# Patient Record
Sex: Female | Born: 1955 | Race: White | Hispanic: No | Marital: Married | Smoking: Former smoker
Health system: Southern US, Community
[De-identification: ages and names within clinical notes are randomized; demographics above are authoritative.]

## PROBLEM LIST (undated history)

## (undated) DIAGNOSIS — J309 Allergic rhinitis, unspecified: Secondary | ICD-10-CM

## (undated) DIAGNOSIS — K759 Inflammatory liver disease, unspecified: Secondary | ICD-10-CM

## (undated) DIAGNOSIS — E785 Hyperlipidemia, unspecified: Secondary | ICD-10-CM

## (undated) DIAGNOSIS — Z801 Family history of malignant neoplasm of trachea, bronchus and lung: Secondary | ICD-10-CM

## (undated) DIAGNOSIS — E079 Disorder of thyroid, unspecified: Secondary | ICD-10-CM

## (undated) DIAGNOSIS — Z803 Family history of malignant neoplasm of breast: Secondary | ICD-10-CM

## (undated) DIAGNOSIS — I2510 Atherosclerotic heart disease of native coronary artery without angina pectoris: Secondary | ICD-10-CM

## (undated) DIAGNOSIS — K589 Irritable bowel syndrome without diarrhea: Secondary | ICD-10-CM

## (undated) DIAGNOSIS — A6920 Lyme disease, unspecified: Secondary | ICD-10-CM

## (undated) HISTORY — DX: Allergic rhinitis, unspecified: J30.9

## (undated) HISTORY — DX: Atherosclerotic heart disease of native coronary artery without angina pectoris: I25.10

## (undated) HISTORY — DX: Lyme disease, unspecified: A69.20

## (undated) HISTORY — DX: Irritable bowel syndrome, unspecified: K58.9

## (undated) HISTORY — DX: Family history of malignant neoplasm of trachea, bronchus and lung: Z80.1

## (undated) HISTORY — DX: Hyperlipidemia, unspecified: E78.5

## (undated) HISTORY — DX: Disorder of thyroid, unspecified: E07.9

## (undated) HISTORY — DX: Inflammatory liver disease, unspecified: K75.9

## (undated) HISTORY — DX: Family history of malignant neoplasm of breast: Z80.3

---

## 1979-04-30 HISTORY — PX: TMJ ARTHROPLASTY: SHX1066

## 1987-07-30 HISTORY — PX: TUBAL LIGATION: SHX77

## 1994-08-29 HISTORY — PX: CORONARY ANGIOPLASTY WITH STENT PLACEMENT: SHX49

## 1996-12-24 HISTORY — PX: CORONARY ANGIOPLASTY WITH STENT PLACEMENT: SHX49

## 2001-06-25 ENCOUNTER — Other Ambulatory Visit: Admission: RE | Admit: 2001-06-25 | Discharge: 2001-06-25 | Payer: Self-pay | Admitting: Obstetrics and Gynecology

## 2003-05-12 ENCOUNTER — Encounter: Admission: RE | Admit: 2003-05-12 | Discharge: 2003-05-12 | Payer: Self-pay | Admitting: Obstetrics and Gynecology

## 2003-05-12 ENCOUNTER — Encounter: Payer: Self-pay | Admitting: Obstetrics and Gynecology

## 2005-01-25 ENCOUNTER — Emergency Department (HOSPITAL_COMMUNITY): Admission: EM | Admit: 2005-01-25 | Discharge: 2005-01-25 | Payer: Self-pay | Admitting: Emergency Medicine

## 2006-04-18 ENCOUNTER — Ambulatory Visit (HOSPITAL_BASED_OUTPATIENT_CLINIC_OR_DEPARTMENT_OTHER): Admission: RE | Admit: 2006-04-18 | Discharge: 2006-04-18 | Payer: Self-pay | Admitting: Orthopedic Surgery

## 2006-04-23 ENCOUNTER — Emergency Department (HOSPITAL_COMMUNITY): Admission: EM | Admit: 2006-04-23 | Discharge: 2006-04-23 | Payer: Self-pay | Admitting: Family Medicine

## 2007-01-25 ENCOUNTER — Observation Stay (HOSPITAL_COMMUNITY): Admission: EM | Admit: 2007-01-25 | Discharge: 2007-01-27 | Payer: Self-pay | Admitting: Emergency Medicine

## 2007-01-26 HISTORY — PX: CORONARY ANGIOPLASTY WITH STENT PLACEMENT: SHX49

## 2007-03-20 ENCOUNTER — Emergency Department: Payer: Self-pay | Admitting: Emergency Medicine

## 2010-10-29 HISTORY — PX: US ECHOCARDIOGRAPHY: HXRAD669

## 2010-10-29 HISTORY — PX: NM MYOCAR PERF WALL MOTION: HXRAD629

## 2011-01-11 NOTE — Cardiovascular Report (Signed)
Jordan Contreras, Jordan Contreras             ACCOUNT NO.:  [id number]   MEDICAL RECORD NO.:  [id number]          PATIENT TYPE:  INP   LOCATION:  3740                         FACILITY:  [facility]   PHYSICIAN:  Thereasa Solo. Little, M.D. DATE OF BIRTH:  04-Apr-1956   DATE OF PROCEDURE:  01/26/2007  DATE OF DISCHARGE:                            CARDIAC CATHETERIZATION   This 55 year old female was admitted with unstable angina.  She had two  overlapping 3-0 x 15 Palmaz-Schatz stents placed in her LAD in 1996.  In  1998, she presented with unstable angina and had in-stent restenosis  that was treated with a cutting balloon.  She has had minimal followup  since that time but comes back in with chest pain.   After obtaining informed consent the patient was prepped and draped in  the usual sterile fashion exposing the right groin.  Following local  anesthetic with 1% Xylocaine, the Seldinger technique was employed and a  5-French introducer sheath was placed in the right femoral artery.  Left  and right coronary arteriography and ventriculography in the RAO  projection was performed.   Following this, percutaneous intervention to her LAD was performed.   COMPLICATIONS:  None.   MEDICATIONS:  Versed 2 mg, and IV Angiomax with an ACT of 355.   EQUIPMENT:  Five-French Judkins configuration diagnostic catheters.   TOTAL CONTRAST USED:  One hundred sixty mL.   RESULTS:  1. HEMODYNAMIC MONITORING:  Her central aortic pressure was 138/74.      Her left ventricular pressure was 138/3, and there was no aortic      valve gradient noted at the time of pullback.  2. VENTRICULOGRAPHY:  Ventriculography was performed at the end of the      diagnostic cath showed the left ventricle to be normal to      hyperdynamic in function.  The ejection fraction was in excess of      65%, with no focal wall motion abnormalities.  End-diastolic      pressure was 10, and no mitral regurgitation was appreciated.   CORONARY  ARTERIOGRAPHY:  1. Left main normal.  It bifurcated.  2. Circumflex.  The circumflex was free of disease and it had two      large marginal vessels.  3. LAD.  The LAD went to the apex of the heart.  In the mid portion of      the LAD was an area of stents that appeared to have 90% in-stent      restenosis at the second diagonal.  The first diagonal had 40%      ostial narrowing.  The second diagonal had 30% ostial narrowing.  4. Right coronary artery.  The right coronary artery gave rise to a      PDA which was free of disease.  The mid portion of the RCA had 20-      30% eccentric narrowing.   Because of the high-grade in-stent restenosis in the LAD, arrangements  were made for intervention.  Angio max was given and an ACT of 355 was  obtained.   The 5-French system was upgraded  to a 6-French.  A JL-4 6-French guide  catheter was used and a short Luge wire.  The wire was placed well  distal in the LAD.  Initially at 3.0 x 10 cutting balloon was placed  into the stent with two inflations 5 x 33, and 5 x 34.  With these  inflations, I never felt that I was adequately able to maintain the  balloon within the lesion.  It tended to want to watermelon proximally.   A 2.5 x 12 Maverick balloon was then used and placed in the area of the  obstruction.  A single inflation of 6 atmospheres for 33 seconds  resulted in the area being reduced to less than 40%.   A 3.0 x 10 cutting balloon was then replaced into the area and a series  of five inflations 5 for 43, 5 for 37, 5 for 47, and 6 atmospheres for  45  seconds was performed.  With this, the area that had been 90%  narrowed previously, now appeared to be normal.  There was no evidence  of any dissection or thrombus and care was taken to make sure that the  balloon stayed within the stents.   With each inflation, there was marked ST-segment shifts, PVCs, and the  patient experienced chest pain.  All this resolved with deflating the   balloon.   At the end of the procedure, there was concern of the second diagonal  having about 70% ostial narrowing.  I gave her intracoronary  nitroglycerin and there was no substantial change.  I did not feel that  going after this lesion and taking a chance to jeopardize the LAD was  appropriate.   Her Angiomax was discontinued.  She is already on Plavix.  We will  remove sheath later today.  In addition, we will try to wean and  discontinue the nitroglycerin and depending on her labs, she may be  ready for discharge tomorrow with followup with Dr. Tresa Endo.           ______________________________  Thereasa Solo Little, M.D.     ABL/MEDQ  D:  01/26/2007  T:  01/26/2007  Job:  [id number]   cc:   Nicki Guadalajara, M.D.

## 2011-01-11 NOTE — H&P (Signed)
NAMEKIMBERLYE, Jordan Contreras             ACCOUNT NO.:  [id number]   MEDICAL RECORD NO.:  [id number]          PATIENT TYPE:  INP   LOCATION:  1829                         FACILITY:  [facility]   PHYSICIAN:  Nicki Guadalajara, M.D.     DATE OF BIRTH:  1956-04-05   DATE OF ADMISSION:  01/25/2007  DATE OF DISCHARGE:                              HISTORY & PHYSICAL   CHIEF COMPLAINT:  Chest pain.   [name] is a 55 year old Caucasian female, patient of Dr. Tresa Endo,  with a previous history of coronary disease, presented to the emergency  room for evaluation of chest pain.   Onset of pain was on Saturday when patient woke up in the morning, went  to take a shower and then dressed, came downstairs to the kitchen to  take her medications and literally was dabbled with pain.  The pain was  so severe that she could not move, it radiated to her neck and left jaw.  She was all covered in sweat, very weak, nauseated and felt shortness of  breath.  This pain persisted throughout the day off and on and patient  rested and eventually felt better.  The next day, she felt significantly  better and there was a moment when she thought that she would have  another attack of pain, but it actually never occurred.  For the last  couple of days, she has been feeling well, except some mild tiredness.  Last evening, after work, she decided to try to walk on the treadmill  and was able to make a few steps and developed severe, profound dyspnea.  She stopped, went home and this morning had another episode of chest  pain with radiation to the neck and she also felt severe exertional  dyspnea.  Every time she would try to move, even minimal exertion, would  bring on shortness of breath and chest discomfort.  Patient took aspirin  last night and this morning and presented to the emergency room for  evaluation.   PAST MEDICAL HISTORY:  Significant for:  1. Coronary disease.  In 1996, she had a stenting of the left anterior   descending.  Another catheterization was in 1998 and per patient's      report, at that time it was angioplasty performed at that time.      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      Unfortunately, we do not have any access to her medical records      because we do not have any records in our office, patient never      followed up with Dr. Tresa Endo in outpatient setting.  I requested old      catheterization reports from the medical records and medical      hospital and they are pending.  2. Also significant for hypothyroidism, treated.  3. Hyperlipidemia, for a long time patient was in Lipitor studies at      [facility].  4. She said that around Christmas time, she had been seen at [facility] with bloody diarrhea that was quite  profound and lasted      for a few days, but she had no recurrence and never followed up      with the gastroenterologist after that.   FAMILY HISTORY:  Significant for coronary artery disease.   SOCIAL HISTORY:  Patient is married, lives with her husband, has 3  children and 1 grandchild.  She is active.  She has horses and dogs and  constantly is doing something either with her pets, with her horses or  something around her yard and at home, but she is not enrolled in any  regular physical activity in a gym or any sports.   She smokes a half of a pack of cigarettes a day and was trying to take  Chantix, but could not tolerate the side effects it gave her a stomach  ache.   REVIEW OF SYSTEMS:  Currently, patient still has some chest discomfort  and some throat fullness, but there are no radiation to the neck, jaw,  shoulder or arm.  She does not experience any dyspnea at rest, but had  severe dyspnea with minimal exertion.  She denied any palpitations.  She  had indigestion for the last couple of weeks lately.  She denied any  melena, any hematuria or dysuria.  There is no nausea or vomiting.  No  diarrhea or  constipation.  There is no claudication or lower extremity  swelling.  No complaints of night sweats or chills.   ALLERGIES:  NO KNOWN DRUG ALLERGIES.   PHYSICAL EXAMINATION:  VITAL SIGNS:  Her blood pressure is 161/93.  Temperature 97.7.  Pulse 68.  Respirations 16.  O2 saturations 98% on  room air.  HEENT:  Normocephalic, atraumatic.  Extraocular movements intact.  NECK:  Without any JVD or carotid bruits.  LUNGS:  Clear to auscultation bilaterally.  I did not hear any wheezes,  rhonchi or rales.  HEART:  Regular rate and rhythm.  There are no murmurs, rubs or gallops.  ABDOMEN:  Soft, nontender, nondistended with positive bowel sounds x4.  EXTREMITIES:  No edema and intact pedal pulses.   EKG showed normal sinus rhythm.  There are no acute ST/T wave changes.   Her chest x-ray showed no acute cardiopulmonary process.   LABORATORY DATA:  Still pending at this time.   IMPRESSION:  1. Unstable angina pectoris.  We will admit on a rule out myocardial      infarction protocol and IV nitro and heparin, cycle enzymes and all      further recommendations per MD.  2. Elevated blood pressure.  We will start beta-blockers.  3. Hyperlipidemia - treated.  4. Hypothyroidism.  5. Known previous history of coronary artery disease with stenting of      the left anterior descending remotely.   MD will come to evaluate patient later.      Raymon Mutton, P.A.    ______________________________  Nicki Guadalajara, M.D.    MK/MEDQ  D:  01/25/2007  T:  01/25/2007  Job:  [id number]

## 2011-01-14 NOTE — Discharge Summary (Signed)
NAMEGERALDA, Jordan Contreras             ACCOUNT NO.:  [id number]   MEDICAL RECORD NO.:  [id number]          PATIENT TYPE:  INP   LOCATION:  6531                         FACILITY:  [facility]   PHYSICIAN:  Lezlie Octave, N.P.     DATE OF BIRTH:  11-13-1955   DATE OF ADMISSION:  01/25/2007  DATE OF DISCHARGE:  01/27/2007                               DISCHARGE SUMMARY   HOSPITAL COURSE:  The patient is a 55 year old female patient with known  coronary disease.  She came into the emergency room with the onset of  chest pain, radiation to the neck, and shortness of breath, nausea and  throat fullness.  She was started on IV heparin.  It was decided that  she needed to undergo cardiac cath.  Cath revealed that she had mid LAD  90% instant restenosis at the second diagonal.  First diagonal was  ostial 40%.  Second diagonal was ostial 30%.  RCA showed mid 20-30%.  The circumflex was okay and the left main was normal.  Her EF was  greater than 65%.  She subsequently had a PCI to instant LAD stenosis.  This was performed by Dr. Clarene Duke, please see his note for complete  detail.  She also had a cutting balloon of her ostium of the second  diagonal 70% post procedure.  The following day, she felt somewhat weak  but she had no chest pain or shortness of breath.  Her blood pressure  was normal, 133/66.  Her heart rate was 56.  Temperature was 98.4.  Her  O2 sats were normal at 94%.  She was seen by Dr. Tresa Endo and considered  stable for discharge home.   LABORATORY DATA:  Hemoglobin 13.3, hematocrit 39.1, platelets 183, WBC  9.5.  Sodium 143, potassium 3.7, A1c greater than 0.84, glucose 102.  CK-  MB was 100/2.4 and troponin of 0.14.   Other labs - TSH 2.794, total cholesterol 178, triglycerides 134, HDL  36, LDL 115, AST 25, ALT 22.   DISCHARGE MEDICATIONS:  1. Synthroid 25 mcg a day.  2. Aspirin 81 mg a day.  3. Metoprolol ER 25 mg once a day.  4. Protonix 40 mg a day or Prilosec 20 mg daily that she  can buy over-      the-counter.  5. Plavix 25 mg a day.  6. Lisinopril 10 mg a day.  7. Lipitor 80 mg a day.  8. Nitroglycerin 150 under her tongue when needed for chest pain.   DISCHARGE INSTRUCTIONS:  She should see Dr. Tresa Endo in two weeks.  She  should do no strenuous activity, pushing, pulling or lifting for a week.  She should increase her activity slowly.   DISCHARGE DIAGNOSES:  1. Unstable angina.  2. Progressive coronary artery disease, status post cardiac      catheterization with instant restenosis of her LAD stent.      Subsequent PCI.  She also had a cutting balloon of her ostium of      her diagonal two lesion.  3. Normal EF 65%.  4. Hypothyroidism.  5. Hyperlipidemia.  Statin increased at the  time of admission.  6. Gastroesophageal reflux disease.  7. Hypertension.      Lezlie Octave, N.P.     BB/MEDQ  D:  03/16/2007  T:  03/17/2007  Job:  [id number]

## 2011-01-14 NOTE — Op Note (Signed)
NAMELANISSA, CASHEN             ACCOUNT NO.:  [id number]   MEDICAL RECORD NO.:  [id number]          PATIENT TYPE:  AMB   LOCATION:  DSC                          FACILITY:  [facility]   PHYSICIAN:  Cindee Salt, M.D.       DATE OF BIRTH:  Jul 31, 1956   DATE OF PROCEDURE:  04/18/2006  DATE OF DISCHARGE:                                 OPERATIVE REPORT   PREOPERATIVE DIAGNOSIS:  Stenosing tenosynovitis, right thumb.   POSTOPERATIVE DIAGNOSIS:  Stenosing tenosynovitis, right thumb.   OPERATION:  Release of A1 pulley of right thumb.   SURGEON:  Cindee Salt, M.D.   ASSISTANT:  Joaquin Courts, R.N.   ANESTHESIA:  Forearm-based IV regional.   HISTORY:  The patient is a 55 year old female with a history of triggering  of her right thumb, which has not responded to conservative treatment.  She  is desirous of proceeding to have this released and well-aware of risks and  complications including infection, recurrence, injury to arteries, nerves  and tendons, incomplete relief of symptoms and dystrophy; she is desirous of  proceeding.  In the preoperative area, the extremity is marked by both the  patient and surgeon, questions encouraged and answered, antibiotic given.   PROCEDURE:  The patient was brought to the operating room, where a forearm-  based IV regional anesthetic was carried out without difficulty.  She was  prepped using DuraPrep.  Unfortunately, the tourniquet deflated during this  period of time; this was rewrapped, reinflated, re-injected.  After adequate  anesthesia was afforded to the patient, a transverse incision was made over  the A1 pulley of the right thumb and carried down through subcutaneous  tissue.  Bleeders were electrocauterized.  The neurovascular structures were  identified and protected, retractors placed.  The A1 pulley was identified.  This was released in its radial aspect, taking care to protect the oblique  pulley.  No further lesions were identified.  The wound  was placed through a  full range motion; no further triggering was noted.  The wound was  irrigated.  The skin was then closed with interrupted 5-0 nylon sutures.  A  sterile compressive dressing was applied.  The patient tolerated the  procedure well and was taken to the recovery room for observation in  satisfactory condition.   She is discharged home to return to the [facility] [place] in 1 week  on Vicodin.           ______________________________  Cindee Salt, M.D.     GK/MEDQ  D:  04/18/2006  T:  04/19/2006  Job:  [id number]

## 2011-03-29 ENCOUNTER — Emergency Department (HOSPITAL_COMMUNITY)
Admission: EM | Admit: 2011-03-29 | Discharge: 2011-03-30 | Disposition: A | Discharge status: Home / Self Care | Payer: BC Managed Care – PPO | Attending: Emergency Medicine | Admitting: Emergency Medicine

## 2011-03-29 ENCOUNTER — Emergency Department (HOSPITAL_COMMUNITY): Payer: BC Managed Care – PPO

## 2011-03-29 DIAGNOSIS — I2510 Atherosclerotic heart disease of native coronary artery without angina pectoris: Secondary | ICD-10-CM | POA: Insufficient documentation

## 2011-03-29 DIAGNOSIS — E7800 Pure hypercholesterolemia, unspecified: Secondary | ICD-10-CM | POA: Insufficient documentation

## 2011-03-29 DIAGNOSIS — R112 Nausea with vomiting, unspecified: Secondary | ICD-10-CM | POA: Insufficient documentation

## 2011-03-29 DIAGNOSIS — K589 Irritable bowel syndrome without diarrhea: Secondary | ICD-10-CM | POA: Insufficient documentation

## 2011-03-29 DIAGNOSIS — I498 Other specified cardiac arrhythmias: Secondary | ICD-10-CM | POA: Insufficient documentation

## 2011-03-29 DIAGNOSIS — R05 Cough: Secondary | ICD-10-CM | POA: Insufficient documentation

## 2011-03-29 DIAGNOSIS — R197 Diarrhea, unspecified: Secondary | ICD-10-CM | POA: Insufficient documentation

## 2011-03-29 DIAGNOSIS — R059 Cough, unspecified: Secondary | ICD-10-CM | POA: Insufficient documentation

## 2011-03-29 DIAGNOSIS — R51 Headache: Secondary | ICD-10-CM | POA: Insufficient documentation

## 2011-03-29 DIAGNOSIS — M542 Cervicalgia: Secondary | ICD-10-CM | POA: Insufficient documentation

## 2011-03-29 DIAGNOSIS — R0602 Shortness of breath: Secondary | ICD-10-CM | POA: Insufficient documentation

## 2011-03-29 LAB — HEPATIC FUNCTION PANEL
AST: 24 U/L (ref 0–37)
Albumin: 3.8 g/dL (ref 3.5–5.2)
Total Protein: 7.2 g/dL (ref 6.0–8.3)

## 2011-03-29 LAB — DIFFERENTIAL
Basophils Relative: 0 % (ref 0–1)
Eosinophils Absolute: 0 10*3/uL (ref 0.0–0.7)
Lymphs Abs: 2.7 10*3/uL (ref 0.7–4.0)
Metamyelocytes Relative: 0 %
Monocytes Absolute: 1.3 10*3/uL — ABNORMAL HIGH (ref 0.1–1.0)
Monocytes Relative: 12 % (ref 3–12)
Myelocytes: 0 %
Neutrophils Relative %: 64 % (ref 43–77)
Promyelocytes Absolute: 0 %
nRBC: 0 /100 WBC

## 2011-03-29 LAB — CBC
Hemoglobin: 14.6 g/dL (ref 12.0–15.0)
MCHC: 34.8 g/dL (ref 30.0–36.0)
Platelets: 326 10*3/uL (ref 150–400)
RBC: 4.84 MIL/uL (ref 3.87–5.11)
RDW: 13.8 % (ref 11.5–15.5)

## 2011-03-29 LAB — BASIC METABOLIC PANEL
BUN: 11 mg/dL (ref 6–23)
CO2: 26 mEq/L (ref 19–32)
Chloride: 103 mEq/L (ref 96–112)
Creatinine, Ser: 0.68 mg/dL (ref 0.50–1.10)
GFR calc Af Amer: >60 mL/min (ref >60)
GFR calc non Af Amer: >60 mL/min (ref >60)
Sodium: 140 mEq/L (ref 135–145)

## 2011-03-29 LAB — TROPONIN I: Troponin I: <0.3 ng/mL (ref <0.30)

## 2012-04-02 ENCOUNTER — Other Ambulatory Visit: Payer: Self-pay

## 2012-04-02 ENCOUNTER — Other Ambulatory Visit: Payer: Self-pay | Admitting: Obstetrics and Gynecology

## 2012-04-02 DIAGNOSIS — Z1231 Encounter for screening mammogram for malignant neoplasm of breast: Secondary | ICD-10-CM

## 2012-04-03 ENCOUNTER — Encounter: Payer: Self-pay | Admitting: Internal Medicine

## 2012-04-27 ENCOUNTER — Ambulatory Visit
Admission: RE | Admit: 2012-04-27 | Discharge: 2012-04-27 | Disposition: A | Discharge status: Home / Self Care | Payer: BC Managed Care – PPO | Source: Ambulatory Visit

## 2012-04-27 DIAGNOSIS — Z1231 Encounter for screening mammogram for malignant neoplasm of breast: Secondary | ICD-10-CM

## 2012-05-02 ENCOUNTER — Encounter: Payer: Self-pay | Admitting: Internal Medicine

## 2012-05-04 ENCOUNTER — Encounter: Payer: Self-pay | Admitting: Internal Medicine

## 2012-05-04 ENCOUNTER — Telehealth: Payer: Self-pay

## 2012-05-04 ENCOUNTER — Ambulatory Visit (INDEPENDENT_AMBULATORY_CARE_PROVIDER_SITE_OTHER): Payer: BC Managed Care – PPO | Admitting: Internal Medicine

## 2012-05-04 VITALS — BP 110/70 | HR 57 | Ht 63.75 in | Wt 138.0 lb

## 2012-05-04 DIAGNOSIS — E039 Hypothyroidism, unspecified: Secondary | ICD-10-CM | POA: Insufficient documentation

## 2012-05-04 DIAGNOSIS — I2510 Atherosclerotic heart disease of native coronary artery without angina pectoris: Secondary | ICD-10-CM

## 2012-05-04 DIAGNOSIS — K589 Irritable bowel syndrome without diarrhea: Secondary | ICD-10-CM

## 2012-05-04 DIAGNOSIS — K5900 Constipation, unspecified: Secondary | ICD-10-CM

## 2012-05-04 DIAGNOSIS — E785 Hyperlipidemia, unspecified: Secondary | ICD-10-CM | POA: Insufficient documentation

## 2012-05-04 DIAGNOSIS — Z1211 Encounter for screening for malignant neoplasm of colon: Secondary | ICD-10-CM

## 2012-05-04 MED ORDER — LINACLOTIDE 145 MCG PO CAPS: 145.0000 ug | ORAL_CAPSULE | Freq: Every day | ORAL | Status: DC

## 2012-05-04 MED ORDER — MOVIPREP 100 G PO SOLR: 1.0000 | Freq: Once | ORAL | Status: DC

## 2012-05-04 NOTE — Telephone Encounter (Signed)
  05/04/2012    RE: COURTLAND COPPA DOB: 07-06-1956 MRN: [id number]   Dear Dr. Tresa Endo,    We have scheduled the above patient for an endoscopic procedure. Our records show that she is on anticoagulation therapy.   Please advise as to how long the patient may come off her therapy of Plavix prior to the procedure, which is scheduled for 05-14-12.  Please fax back/ or route the completed form to [place] at [phone].   Sincerely,  Gracy Racer

## 2012-05-04 NOTE — Patient Instructions (Addendum)
You have been scheduled for a colonoscopy with propofol. Please follow written instructions given to you at your visit today.  Please pick up your prep kit at the pharmacy within the next 1-3 days. If you use inhalers (even only as needed), please bring them with you on the day of your procedure.  You will be contacted by our office prior to your procedure for directions on holding your Plavix.  If you do not hear from our office 1 week prior to your scheduled procedure, please call [phone] to discuss.   We have sent the following medications to your pharmacy for you to pick up at your convenience:  Linzess   We have given you some samples of Linzess to try - take one capsule 30 minutes before your first meal of the day

## 2012-05-04 NOTE — Progress Notes (Signed)
Patient ID: Jordan Contreras, female   DOB: [birth year], 56 y.o.   MRN: [id number]  SUBJECTIVE: HPI Mrs. Jordan Contreras is a 56 yo female with past medical history of CAD status post PCI greater than 10 years ago, hyperlipidemia, hypothyroidism, and irritable bowel constipation predominance who is seen in consultation from Dr. Vear Clock for evaluation of screening colonoscopy. The patient is alone today. She reports having never had colonoscopy for colon cancer screening. She is wanted to avoid this test over anxiety related to the risks, but states that people close to her have urged her to proceed. She reports no new abdominal complaint. She reports a chronic constipation for "as long as she can remember". This is often associated with lower abdominal discomfort but not pain. She only has one affective bowel movement weekly, but on other days has small ineffective stools. She rarely uses laxatives, but when needed will use Dulcolax. No blood in her stool and no bright red blood per rectum. She reports one isolated episode of painless bright blood per rectum approximately 3 years ago but none since. Her appetite has been good. No nausea or vomiting. She continues on Plavix and has been on this medication for many years.  No family history of colon cancer  Review of Systems  As per history of present illness, otherwise negative   Past Medical History  Diagnosis Date  . CAD (coronary artery disease)     premature 04-1995  . 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 Hyperlipidemia   . Thyroid disease   . IBS (irritable bowel syndrome)   . Lyme disease   . Allergic rhinitis     Current Outpatient Prescriptions  Medication Sig Dispense Refill  . aspirin 81 MG tablet Take 81 mg by mouth daily.      . Biotin 1000 MCG tablet Take 1,000 mcg by mouth 3 (three) times daily.      . clopidogrel (PLAVIX) 75 MG tablet Take 75 mg by mouth daily.      . Coenzyme Q10 (CO Q-10) 200 MG CAPS Take 1 tablet by mouth  daily.      Marland Kitchen ezetimibe (ZETIA) 10 MG tablet Take 10 mg by mouth daily.      . fish oil-omega-3 fatty acids 1000 MG capsule Take 2 g by mouth daily.      Marland Kitchen levothyroxine (SYNTHROID, LEVOTHROID) 50 MCG tablet Take 50 mcg by mouth daily.      . Linaclotide (LINZESS) 145 MCG CAPS Take 1 capsule (145 mcg total) by mouth daily.  30 capsule  3  . Linaclotide (LINZESS) 145 MCG CAPS Take 1 capsule (145 mcg total) by mouth daily.  16 capsule  0  . MOVIPREP 100 G SOLR Take 1 kit (100 g total) by mouth once.  1 kit  0  . Multiple Vitamin (MULTIVITAMIN) tablet Take 1 tablet by mouth daily.      . pravastatin (PRAVACHOL) 20 MG tablet Take 20 mg by mouth daily.        No Known Allergies  Family History  Problem Relation Age of Onset  . Lung cancer Sister   . Diabetes Sister   . Heart disease Mother   . Heart disease Father     History  Substance Use Topics  . Smoking status: Former Smoker    Quit date: 12/28/2006  . Smokeless tobacco: Never Used  . Alcohol Use: No    OBJECTIVE: BP 110/70  Pulse 57  Ht 5' 3.75" (  1.619 m)  Wt 138 lb (62.596 kg)  BMI 23.87 kg/m2 Constitutional: Well-developed and well-nourished. No distress. HEENT: Normocephalic and atraumatic. Oropharynx is clear and moist. No oropharyngeal exudate. Conjunctivae are normal. Pupils are equal round and reactive to light. No scleral icterus. Neck: Neck supple. Trachea midline. Cardiovascular: Normal rate, regular rhythm and intact distal pulses. No M/R/G Pulmonary/chest: Effort normal and breath sounds normal. No wheezing, rales or rhonchi. Abdominal: Soft, nontender, nondistended. Bowel sounds active throughout. There are no masses palpable. No hepatosplenomegaly. Extremities: no clubbing, cyanosis, or edema Lymphadenopathy: No cervical adenopathy noted. Neurological: Alert and oriented to person place and time. Skin: Skin is warm and dry. No rashes noted. Psychiatric: Normal mood and affect. Behavior is  normal.   ASSESSMENT AND PLAN: 56 yo female with past medical history of CAD status post PCI greater than 10 years ago, hyperlipidemia, hypothyroidism, and irritable bowel constipation predominance who is seen in consultation from Dr. Vear Clock for evaluation of screening colonoscopy  1. CRC screening -- she is average risk from colon cancer screening standpoint. We have discussed the test today at length including the risks and benefits and she is agreeable to proceed. We will contact her cardiologist, Dr. Tresa Endo, to seek permission to hold Plavix for 5 days prior to colonoscopy, and possibly longer if therapeutic intervention required.  2.  IBS - C -- she has a long-standing history of irritable bowel constipation predominance, he continues to have symptoms that are consistent with this disease. We have discussed a trial of LINZESS 145 mcg daily. She may eventually need to 290 mcg dose. We discussed how the primary side effect is diarrhea. She will contact us if she has any issues with this new medication, or if she feels the dose needs to be increased to 290 mcg.

## 2012-05-07 ENCOUNTER — Encounter: Payer: Self-pay | Admitting: Internal Medicine

## 2012-05-08 ENCOUNTER — Other Ambulatory Visit: Payer: Self-pay | Admitting: Gastroenterology

## 2012-05-08 ENCOUNTER — Telehealth: Payer: Self-pay | Admitting: Gastroenterology

## 2012-05-08 ENCOUNTER — Telehealth: Payer: Self-pay | Admitting: Internal Medicine

## 2012-05-08 NOTE — Telephone Encounter (Signed)
LM for Dr. Tresa Endo or his nurse regarding pt's plavix therapy and how long pt will need to be off prior to her colonoscopy on 05/14/2012

## 2012-05-09 ENCOUNTER — Telehealth: Payer: Self-pay | Admitting: Gastroenterology

## 2012-05-09 NOTE — Telephone Encounter (Signed)
Spoke to pt told her per Dr. Tresa Endo she is to hold her plavix 5 days prior to procedure. Pt stated understanding.

## 2012-05-14 ENCOUNTER — Ambulatory Visit (AMBULATORY_SURGERY_CENTER): Payer: BC Managed Care – PPO | Admitting: Internal Medicine

## 2012-05-14 ENCOUNTER — Encounter: Payer: Self-pay | Admitting: Internal Medicine

## 2012-05-14 VITALS — BP 113/65 | HR 55 | Temp 98.6°F | Resp 16 | Ht 63.0 in | Wt 138.0 lb

## 2012-05-14 DIAGNOSIS — D126 Benign neoplasm of colon, unspecified: Secondary | ICD-10-CM

## 2012-05-14 DIAGNOSIS — K589 Irritable bowel syndrome without diarrhea: Secondary | ICD-10-CM

## 2012-05-14 DIAGNOSIS — Z1212 Encounter for screening for malignant neoplasm of rectum: Secondary | ICD-10-CM

## 2012-05-14 DIAGNOSIS — K5900 Constipation, unspecified: Secondary | ICD-10-CM

## 2012-05-14 DIAGNOSIS — Z1211 Encounter for screening for malignant neoplasm of colon: Secondary | ICD-10-CM

## 2012-05-14 MED ORDER — LINACLOTIDE 290 MCG PO CAPS: 290.0000 ug | ORAL_CAPSULE | Freq: Every day | ORAL | Status: DC

## 2012-05-14 MED ORDER — SODIUM CHLORIDE 0.9 % IV SOLN: 500.0000 mL | INTRAVENOUS | Status: DC

## 2012-05-14 NOTE — Op Note (Signed)
[facility] [address] [place], [address]   COLONOSCOPY PROCEDURE REPORT  PATIENT: Jordan Contreras, Jordan Contreras  MR#: [id number] BIRTHDATE: Aug 14, 1956 , 56  yrs. old GENDER: Female ENDOSCOPIST: Beverley Fiedler, MD REFERRED ON:[id number], Charles PROCEDURE DATE:  05/14/2012 PROCEDURE:   Colonoscopy with cold biopsy polypectomy ASA CLASS:   Class III INDICATIONS:average risk screening, first colonoscopy, and constipation. MEDICATIONS: MAC sedation, administered by CRNA and propofol (Diprivan) 200mg  IV  DESCRIPTION OF PROCEDURE:   After the risks benefits and alternatives of the procedure were thoroughly explained, informed consent was obtained.  A digital rectal exam revealed no rectal mass.   The LB CF-H180AL [id number]  endoscope was introduced through the anus and advanced to the cecum, which was identified by both the appendix and ileocecal valve. No adverse events experienced. The quality of the prep was Moviprep fair  The instrument was then slowly withdrawn as the colon was fully examined.   COLON FINDINGS: A sessile polyp measuring 4 mm in size was found in the proximal transverse colon.  A polypectomy was performed with cold forceps.  The resection was complete and the polyp tissue was completely retrieved.   There was severe diverticulosis noted in the sigmoid colon with associated muscular hypertrophy and mild luminal narrowing.  Retroflexed views revealed no abnormalities. The time to cecum=4 minutes 59 seconds.  Withdrawal time=13 minutes 34 seconds.  The scope was withdrawn and the procedure completed. COMPLICATIONS: There were no complications.  ENDOSCOPIC IMPRESSION: 1.   Sessile polyp measuring 4 mm in size was found in the proximal transverse colon; polypectomy was performed with cold forceps 2.   There was severe diverticulosis noted in the sigmoid colon  RECOMMENDATIONS: 1.  Await pathology results 2.  If the polyp removed today is proven to be an  adenomatous (pre-cancerous) polyp, you will need a repeat colonoscopy in 5 years.  Otherwise you should continue to follow colorectal cancer screening guidelines for "routine risk" patients with colonoscopy in 10 years.  You will receive a letter within 1-2 weeks with the results of your biopsy as well as final recommendations.  Please call my office if you have not received a letter after 3 weeks. 3.  Increase Linzess to 290 mcg daily for constipation, and return to the office in about 1 month. 4.  Can resume Plavix today.  eSigned:  Beverley Fiedler, MD 05/14/2012 10:17 AM  cc: Loma Sender, MD and The Patient   PATIENT NAME:  [name], [name] MR#: [id number]

## 2012-05-14 NOTE — Progress Notes (Signed)
Patient did not experience any of the following events: a burn prior to discharge; a fall within the facility; wrong site/side/patient/procedure/implant event; or a hospital transfer or hospital admission upon discharge from the facility. (G8907) Patient did not have preoperative order for IV antibiotic SSI prophylaxis. (G8918)  

## 2012-05-14 NOTE — Telephone Encounter (Signed)
Pt was notified regarding her therapy, see my previous note.

## 2012-05-14 NOTE — Patient Instructions (Signed)
Colon polyp x 1 removed today, and diverticulosis seen. Handouts given. Resume current medications today. May resume Plavix today. Increase Linzess to 290 mcg daily for constipation. May pick up that medication from your pharmacy. Follow up with Dr.Pyrtle in office in 1 month. Our office will call you with appointment date. Call us if you have not heard from [place]. Call us with any questions or concerns. Thank you!!!   YOU HAD AN ENDOSCOPIC PROCEDURE TODAY AT THE [facility]: Refer to the procedure report that was given to you for any specific questions about what was found during the examination.  If the procedure report does not answer your questions, please call your gastroenterologist to clarify.  If you requested that your care partner not be given the details of your procedure findings, then the procedure report has been included in a sealed envelope for you to review at your convenience later.  YOU SHOULD EXPECT: Some feelings of bloating in the abdomen. Passage of more gas than usual.  Walking can help get rid of the air that was put into your GI tract during the procedure and reduce the bloating. If you had a lower endoscopy (such as a colonoscopy or flexible sigmoidoscopy) you may notice spotting of blood in your stool or on the toilet paper. If you underwent a bowel prep for your procedure, then you may not have a normal bowel movement for a few days.  DIET: Your first meal following the procedure should be a light meal and then it is ok to progress to your normal diet.  A half-sandwich or bowl of soup is an example of a good first meal.  Heavy or fried foods are harder to digest and may make you feel nauseous or bloated.  Likewise meals heavy in dairy and vegetables can cause extra gas to form and this can also increase the bloating.  Drink plenty of fluids but you should avoid alcoholic beverages for 24 hours.  ACTIVITY: Your care partner should take you home directly after the  procedure.  You should plan to take it easy, moving slowly for the rest of the day.  You can resume normal activity the day after the procedure however you should NOT DRIVE or use heavy machinery for 24 hours (because of the sedation medicines used during the test).    SYMPTOMS TO REPORT IMMEDIATELY: A gastroenterologist can be reached at any hour.  During normal business hours, 8:30 AM to 5:00 PM Monday through Friday, call [phone].  After hours and on weekends, please call the GI answering service at [phone] who will take a message and have the physician on call contact you.   Following lower endoscopy (colonoscopy or flexible sigmoidoscopy):  Excessive amounts of blood in the stool  Significant tenderness or worsening of abdominal pains  Swelling of the abdomen that is new, acute  Fever of 100F or higher  FOLLOW UP: If any biopsies were taken you will be contacted by phone or by letter within the next 1-3 weeks.  Call your gastroenterologist if you have not heard about the biopsies in 3 weeks.  Our staff will call the home number listed on your records the next business day following your procedure to check on you and address any questions or concerns that you may have at that time regarding the information given to you following your procedure. This is a courtesy call and so if there is no answer at the home number and we have not heard  from you through the emergency physician on call, we will assume that you have returned to your regular daily activities without incident.  SIGNATURES/CONFIDENTIALITY: You and/or your care partner have signed paperwork which will be entered into your electronic medical record.  These signatures attest to the fact that that the information above on your After Visit Summary has been reviewed and is understood.  Full responsibility of the confidentiality of this discharge information lies with you and/or your care-partner.

## 2012-05-14 NOTE — Progress Notes (Signed)
Propofol given and oxygen managed per [facility] CRNA  Abdominal pressure applied to reach cecum

## 2012-05-15 ENCOUNTER — Telehealth: Payer: Self-pay | Admitting: *Deleted

## 2012-05-15 NOTE — Telephone Encounter (Signed)
  Follow up Call-  Call back number 05/14/2012  Post procedure Call Back phone  # [phone]  Permission to leave phone message Yes     Patient questions:  Do you have a fever, pain , or abdominal swelling? no Pain Score  0 *  Have you tolerated food without any problems? yes  Have you been able to return to your normal activities? yes  Do you have any questions about your discharge instructions: Diet   no Medications  no Follow up visit  no  Do you have questions or concerns about your Care? no  Actions: * If pain score is 4 or above: No action needed, pain <4.

## 2012-05-17 ENCOUNTER — Encounter: Payer: Self-pay | Admitting: Internal Medicine

## 2012-05-21 ENCOUNTER — Telehealth: Payer: Self-pay | Admitting: *Deleted

## 2012-05-21 NOTE — Telephone Encounter (Signed)
Mailed pt a letter with f/u appt with Dr Rhea Belton.

## 2012-06-11 ENCOUNTER — Encounter: Payer: Self-pay | Admitting: Internal Medicine

## 2012-06-12 ENCOUNTER — Encounter: Payer: Self-pay | Admitting: Internal Medicine

## 2012-06-12 ENCOUNTER — Ambulatory Visit (INDEPENDENT_AMBULATORY_CARE_PROVIDER_SITE_OTHER): Payer: BC Managed Care – PPO | Admitting: Internal Medicine

## 2012-06-12 VITALS — BP 118/68 | HR 60 | Ht 63.0 in | Wt 137.0 lb

## 2012-06-12 DIAGNOSIS — K5730 Diverticulosis of large intestine without perforation or abscess without bleeding: Secondary | ICD-10-CM | POA: Insufficient documentation

## 2012-06-12 DIAGNOSIS — K5900 Constipation, unspecified: Secondary | ICD-10-CM

## 2012-06-12 DIAGNOSIS — R1013 Epigastric pain: Secondary | ICD-10-CM

## 2012-06-12 MED ORDER — PANTOPRAZOLE SODIUM 40 MG PO TBEC: 40.0000 mg | DELAYED_RELEASE_TABLET | Freq: Every day | ORAL | Status: DC

## 2012-06-12 NOTE — Patient Instructions (Addendum)
You have been scheduled for an abdominal U/S at [facility] on 06/15/2012  At 8:00am  Please arrive 15 minutes prior to appointment. If you cannot keep this appointment please call [phone] to reschedule  We have sent the following medications to your pharmacy for you to pick up at your convenience: Protonix 40 mg daily, take Linzess as needed  Follow up with Dr. Rhea Belton in 8 weeks

## 2012-06-12 NOTE — Progress Notes (Signed)
Subjective:    Patient ID: Jordan Contreras, female    DOB: 1956/07/28, 56 y.o.   MRN: [id number]  HPI Mrs. Jordan Contreras is a 56 yo female with PMH of CAD status post PCI greater than 10 years ago, hyperlipidemia, hypothyroidism, and irritable bowel constipation predominance who is seen in follow-up. She is alone today. She had a colonoscopy performed on 05/14/2012 which revealed a 4 mm transverse colon polyp, severe sigmoid diverticulosis.  The polyp was not adenomatous. She was given LINZESS 290 mcg for her constipation. She really liked her response to [facility], but has found she does not need it routinely. Her constipation tends to come and go, and in the past she had used other laxatives which caused a lot of lower abdominal cramping and discomfort. LINZESS seems to improve her constipation without cramping or discomfort. Currently her bowel habits are regular she is having a soft but formed brown stool every 2-3 days which she considers very good. She denies lower abdominal pain. She's had no rectal bleeding or melena. At her daughter's request, she reports intermittent epigastric pressure/discomfort. This tends to occur after eating sometimes several hours after, and has woken her from sleep. The pain has not followed any specific pattern and can occur in the morning or in the evening. She's tried TUMS for this but it has not helped completely. She does continue on omeprazole 20 mg daily. She's not having heartburn. She does have a history of coronary artery disease and she feels very strongly that this is not her cardiac pain. No dysphagia, odynophagia. Occasionally the pain is associated with mild nausea but no vomiting. Appetite has been good.   Review of Systems As per history of present illness, otherwise negative  Current Medications, Allergies, Past Medical History, Past Surgical History, Family History and Social History were reviewed in [employer] record.       Objective:   Physical Exam BP 118/68  Pulse 60  Ht 5\' 3"  (1.6 m)  Wt 137 lb (62.143 kg)  BMI 24.27 kg/m2 Constitutional: Well-developed and well-nourished. No distress. HEENT: Normocephalic and atraumatic. Oropharynx is clear and moist. No oropharyngeal exudate. Conjunctivae are normal.  No scleral icterus. Neck: Neck supple. Trachea midline. Cardiovascular: Normal rate, regular rhythm and intact distal pulses. No M/R/G Pulmonary/chest: Effort normal and breath sounds normal. No wheezing, rales or rhonchi. Abdominal: Soft, mild epigastric tenderness without rebound or guarding, nondistended. Bowel sounds active throughout. There are no masses palpable. No hepatosplenomegaly. Extremities: no clubbing, cyanosis, or edema Lymphadenopathy: No cervical adenopathy noted. Neurological: Alert and oriented to person place and time. Skin: Skin is warm and dry. No rashes noted. Psychiatric: Normal mood and affect. Behavior is normal.    Assessment & Plan:  56 yo female with PMH of CAD status post PCI greater than 10 years ago, hyperlipidemia, hypothyroidism, and irritable bowel constipation predominance who is seen in follow-up.  1.  Constipation -- the patient's constipation is now resolved, and I do think it is acceptable for her to use LINZESS 290 mcg on a more as-needed basis than daily. She has a prescription for this with refills. She is asked to call us if her constipation returns in fails to respond to [facility]. She's also asked to notify us should she develop lower abdominal pain, rectal bleeding.  2.  CRC screening -- the patient had colonoscopy, see above. I recommend repeating the test in September 2018 based on fair preparation, though the polyp removed was not adenomatous.  3.  Epigastric pressure -- the differential is acid peptic and gallstones at this point. She is on omeprazole 20 mg, and I'll switch her to pantoprazole 40 mg daily if this will be slightly more potent. I also would  like to perform an abdominal ultrasound to rule out gallstones. If she does not have gallstones, and her pain has not fully responded to the new and higher dose PPI, and I recommended upper endoscopy. She understands this recommendation

## 2012-06-14 ENCOUNTER — Telehealth: Payer: Self-pay | Admitting: *Deleted

## 2012-06-14 DIAGNOSIS — R1013 Epigastric pain: Secondary | ICD-10-CM

## 2012-06-14 NOTE — Telephone Encounter (Signed)
Pt called to report she can get the U/S cheaper at another facility other than a hospital; this was per her insurance company. R/S her to [facility] for 06/18/12 at 0800am. Faxed them the order [phone]. Pt informed.

## 2012-06-15 ENCOUNTER — Ambulatory Visit (HOSPITAL_COMMUNITY): Payer: BC Managed Care – PPO

## 2012-06-21 ENCOUNTER — Telehealth: Payer: Self-pay | Admitting: Internal Medicine

## 2012-06-21 NOTE — Telephone Encounter (Signed)
Informed pt her u/s was normal; pt stated understanding.

## 2012-06-27 ENCOUNTER — Telehealth: Payer: Self-pay | Admitting: *Deleted

## 2012-06-27 NOTE — Telephone Encounter (Signed)
lmom for pt to call back for results of her abd u/s; results were normal.

## 2012-06-28 NOTE — Telephone Encounter (Signed)
Informed pt of normal u/s; pt stated understanding.

## 2012-06-29 ENCOUNTER — Encounter: Payer: Self-pay | Admitting: Internal Medicine

## 2013-01-31 ENCOUNTER — Telehealth: Payer: Self-pay | Admitting: Cardiovascular Disease

## 2013-01-31 MED ORDER — EZETIMIBE 10 MG PO TABS: 10.0000 mg | ORAL_TABLET | Freq: Every day | ORAL | Status: DC

## 2013-01-31 NOTE — Telephone Encounter (Signed)
Samples left at front desk, [facility] for pt

## 2013-01-31 NOTE — Telephone Encounter (Signed)
Need some samples of Zetia 10mg -Please leave her a message if  you have any!

## 2013-02-28 ENCOUNTER — Telehealth: Payer: Self-pay | Admitting: Cardiovascular Disease

## 2013-02-28 MED ORDER — EZETIMIBE 10 MG PO TABS: 10.0000 mg | ORAL_TABLET | Freq: Every day | ORAL | Status: DC

## 2013-02-28 NOTE — Telephone Encounter (Signed)
Need some samples of Zeitia 10mg  please!

## 2013-02-28 NOTE — Telephone Encounter (Signed)
Call patient and told her she an pick up her zetia samples at the front desk.

## 2013-03-23 IMAGING — CR DG CHEST 2V
2 series · 2 of 2 positions shown · non-contrast
Comparison: 01/25/2007

CLINICAL DATA: Nausea vomiting.  Coronary artery disease.  Previous
myocardial infarcts.

CHEST - 2 VIEW

[w chest pa]
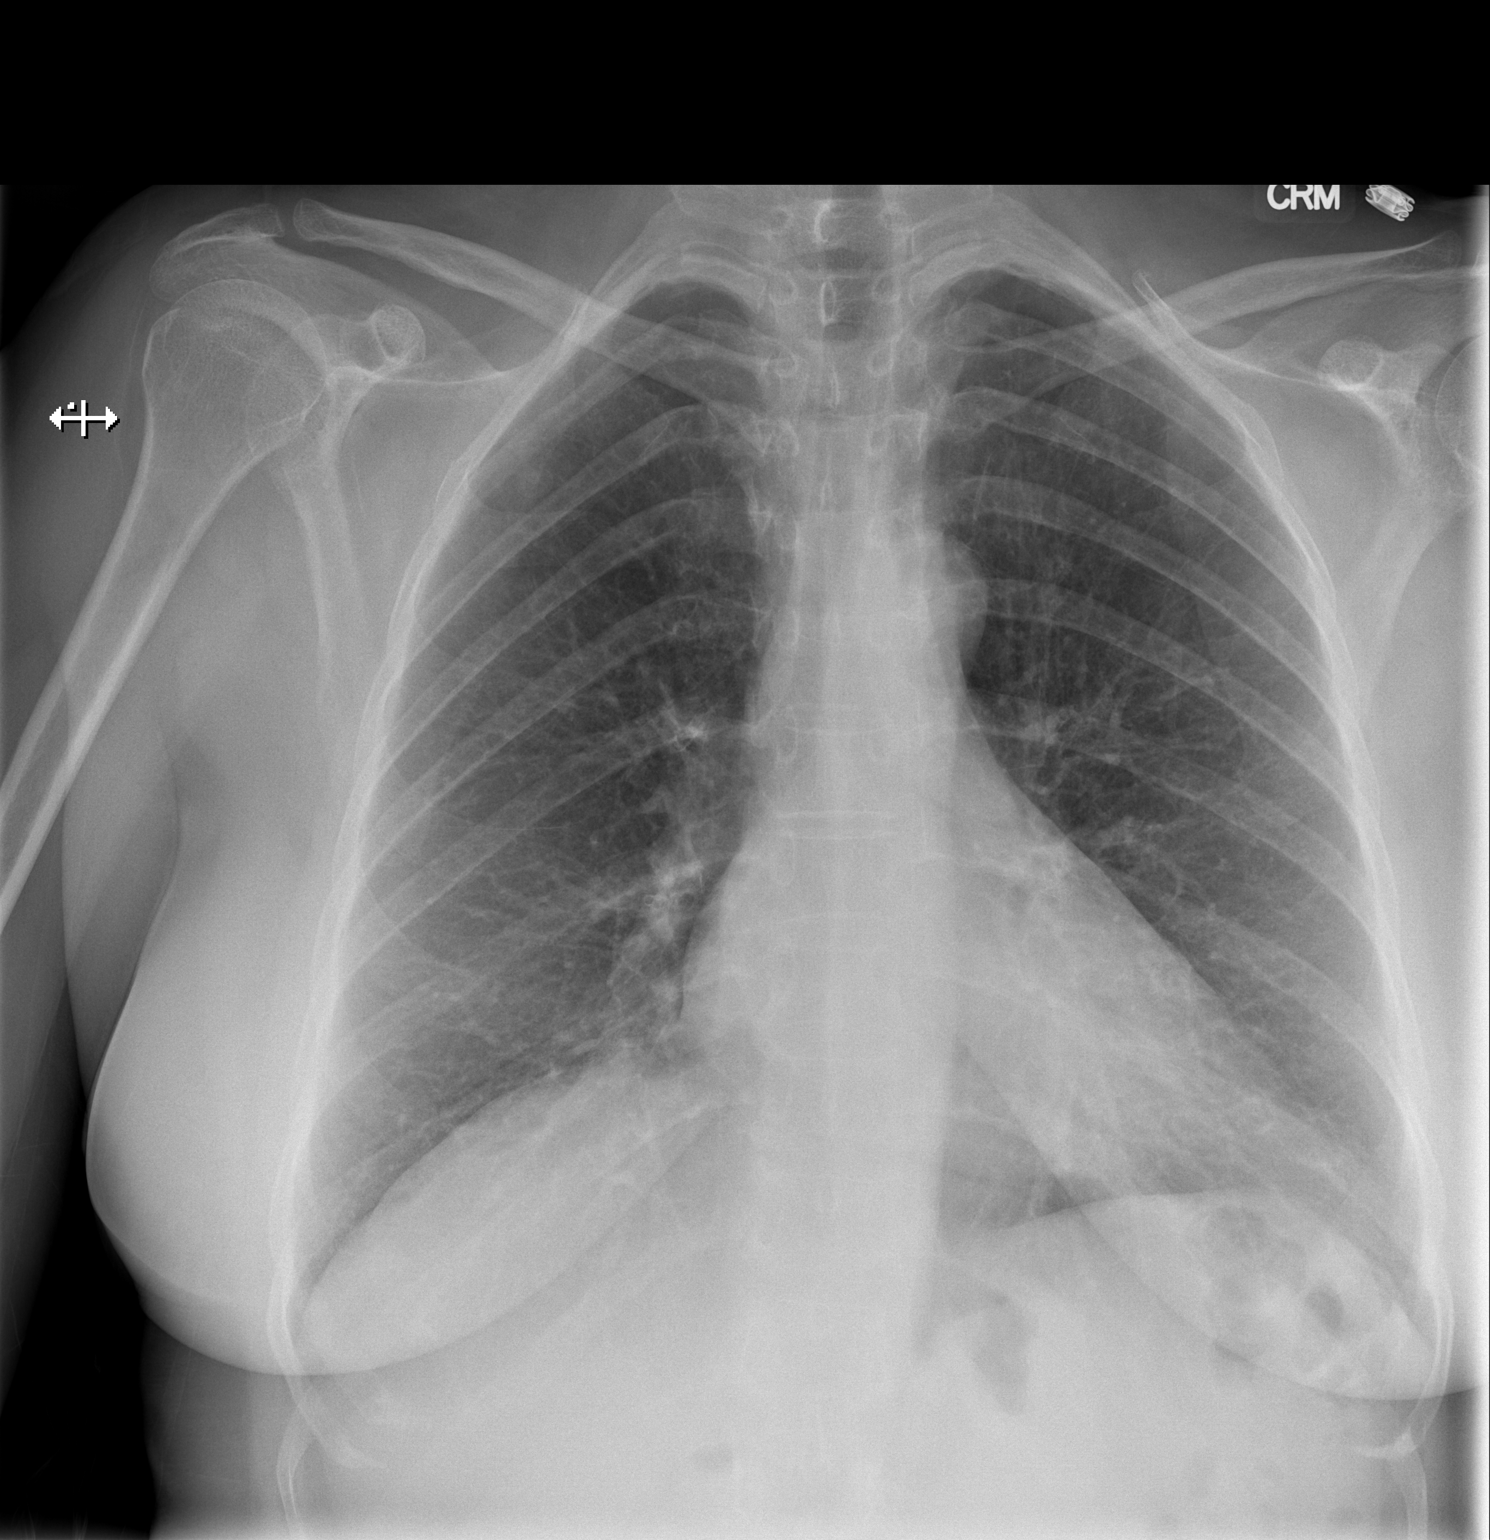

[w chest lat]
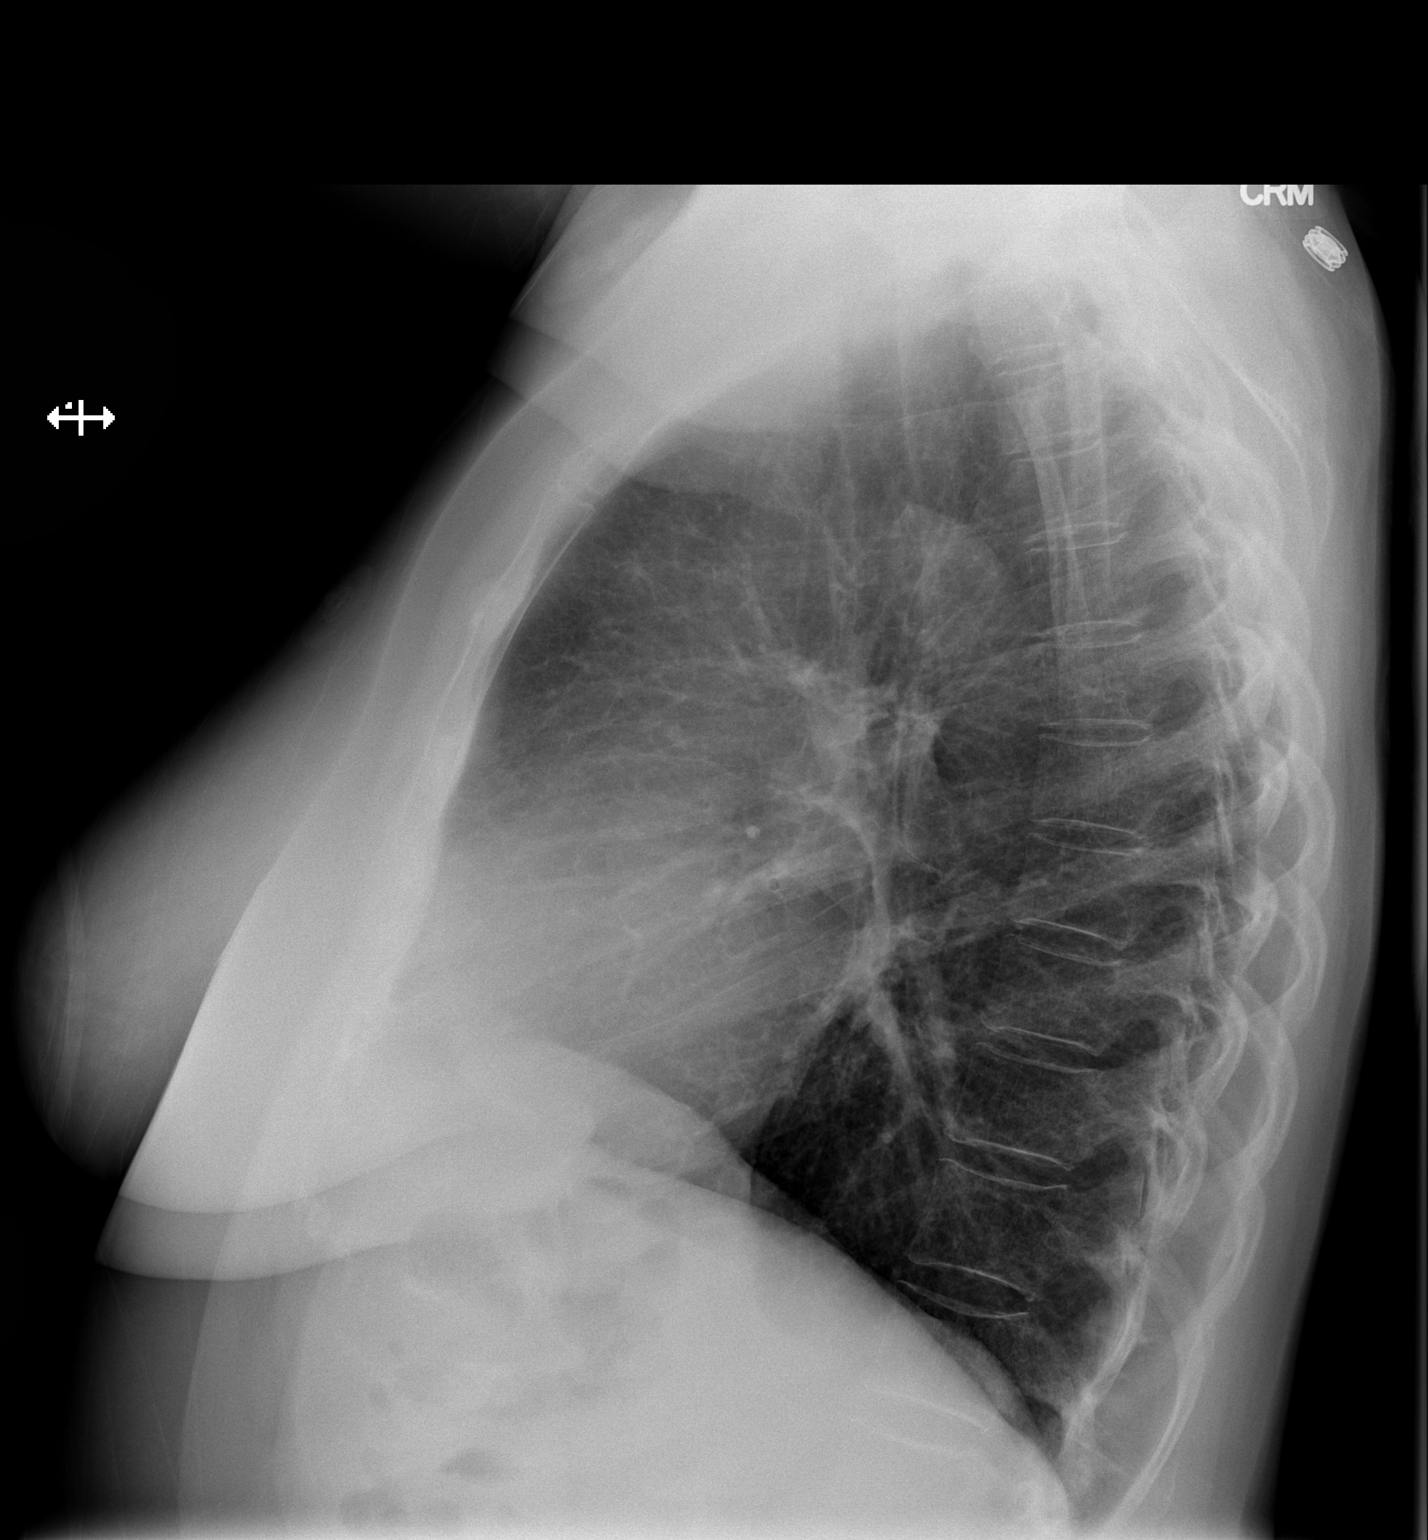

[2 of 2 positions shown; findings below may reference images not displayed]

FINDINGS: The heart size and mediastinal contours are within
normal limits.  Both lungs are clear.  The visualized skeletal
structures are unremarkable.
IMPRESSION: No active cardiopulmonary disease.

## 2013-03-26 ENCOUNTER — Telehealth: Payer: Self-pay | Admitting: Cardiovascular Disease

## 2013-03-26 NOTE — Telephone Encounter (Signed)
Patient would like samples of Zetia 10 mg. 

## 2013-03-27 MED ORDER — EZETIMIBE 10 MG PO TABS: 10.0000 mg | ORAL_TABLET | Freq: Every day | ORAL | Status: DC

## 2013-03-27 NOTE — Telephone Encounter (Signed)
Samples left at front desk for pt pick up.  Lot: [id number] Exp: 01/2015.  Message left for pt that samples at front desk.

## 2013-04-24 ENCOUNTER — Telehealth: Payer: Self-pay | Admitting: Cardiovascular Disease

## 2013-04-24 MED ORDER — EZETIMIBE 10 MG PO TABS: 10.0000 mg | ORAL_TABLET | Freq: Every day | ORAL | Status: DC

## 2013-04-24 NOTE — Telephone Encounter (Signed)
Returned call and left message samples left at front desk.  

## 2013-04-24 NOTE — Telephone Encounter (Signed)
Would like some samples of Zetia 10 mg please. °

## 2013-05-07 ENCOUNTER — Other Ambulatory Visit: Payer: Self-pay | Admitting: Cardiovascular Disease

## 2013-05-08 NOTE — Telephone Encounter (Signed)
Rx was sent to pharmacy electronically. 

## 2013-05-24 ENCOUNTER — Telehealth: Payer: Self-pay | Admitting: Cardiovascular Disease

## 2013-05-24 NOTE — Telephone Encounter (Signed)
Informed patient we are presently out of zetia samples. Offered to phone in a rx. Patient declined.  states that she cannot afford the medication and will call and check back next week.

## 2013-05-24 NOTE — Telephone Encounter (Signed)
Would like to know if you have any samples of Zeita 10mg  .. If so she wil come and pick those up, she only have 2 left..    Thanks

## 2013-06-03 ENCOUNTER — Other Ambulatory Visit: Payer: Self-pay | Admitting: Internal Medicine

## 2013-06-13 ENCOUNTER — Telehealth: Payer: Self-pay | Admitting: Cardiovascular Disease

## 2013-06-13 MED ORDER — EZETIMIBE 10 MG PO TABS: 10.0000 mg | ORAL_TABLET | Freq: Every day | ORAL | Status: DC

## 2013-06-13 NOTE — Telephone Encounter (Signed)
Need some samples of Zetia 10 mg please. °

## 2013-06-13 NOTE — Telephone Encounter (Signed)
Returned call and pt informed samples left at front desk.  Pt verbalized understanding and agreed w/ plan.    

## 2013-07-11 ENCOUNTER — Other Ambulatory Visit: Payer: Self-pay

## 2013-07-11 ENCOUNTER — Telehealth: Payer: Self-pay | Admitting: Cardiovascular Disease

## 2013-07-11 DIAGNOSIS — Z1231 Encounter for screening mammogram for malignant neoplasm of breast: Secondary | ICD-10-CM

## 2013-07-11 MED ORDER — EZETIMIBE 10 MG PO TABS: 10.0000 mg | ORAL_TABLET | Freq: Every day | ORAL | Status: DC

## 2013-07-11 NOTE — Telephone Encounter (Signed)
Needs Zetia 10 mg  Samples if we have  Please call

## 2013-07-11 NOTE — Telephone Encounter (Signed)
Returned call and left message samples left at front desk.  

## 2013-08-09 ENCOUNTER — Telehealth: Payer: Self-pay | Admitting: Cardiovascular Disease

## 2013-08-09 MED ORDER — EZETIMIBE 10 MG PO TABS: 10.0000 mg | ORAL_TABLET | Freq: Every day | ORAL | Status: DC

## 2013-08-09 NOTE — Telephone Encounter (Signed)
Returned call and pt verified x 2.  Pt informed message received.  Informed message received and samples are available.  Pt also informed there is a discount card that will save monthly on Zetia for 12 months.  Pt interested and informed it will be left w/ samples.  Pt verbalized understanding and agreed w/ plan.  Rx for Zetia sent to [facility] per pt request w/ directions to hold until pt requests.

## 2013-08-09 NOTE — Telephone Encounter (Signed)
Returned call.  Left message to call back before 4pm.  

## 2013-08-09 NOTE — Telephone Encounter (Signed)
Need some samples of Zetia 10 mg please. °

## 2013-08-09 NOTE — Telephone Encounter (Signed)
Returned your call.

## 2013-08-14 ENCOUNTER — Ambulatory Visit: Payer: BC Managed Care – PPO

## 2013-08-20 ENCOUNTER — Ambulatory Visit
Admission: RE | Admit: 2013-08-20 | Discharge: 2013-08-20 | Disposition: A | Discharge status: Home / Self Care | Payer: BC Managed Care – PPO | Source: Ambulatory Visit

## 2013-08-20 DIAGNOSIS — Z1231 Encounter for screening mammogram for malignant neoplasm of breast: Secondary | ICD-10-CM

## 2013-09-12 ENCOUNTER — Other Ambulatory Visit: Payer: Self-pay | Admitting: Gastroenterology

## 2013-09-12 MED ORDER — PANTOPRAZOLE SODIUM 40 MG PO TBEC: 40.0000 mg | DELAYED_RELEASE_TABLET | Freq: Every day | ORAL | Status: DC

## 2013-12-31 ENCOUNTER — Encounter: Payer: Self-pay | Admitting: *Deleted

## 2014-01-03 ENCOUNTER — Ambulatory Visit (INDEPENDENT_AMBULATORY_CARE_PROVIDER_SITE_OTHER): Payer: BC Managed Care – PPO | Admitting: Cardiovascular Disease

## 2014-01-03 ENCOUNTER — Encounter: Payer: Self-pay | Admitting: Cardiovascular Disease

## 2014-01-03 VITALS — BP 138/80 | HR 55 | Ht 63.0 in | Wt 142.3 lb

## 2014-01-03 DIAGNOSIS — E785 Hyperlipidemia, unspecified: Secondary | ICD-10-CM

## 2014-01-03 DIAGNOSIS — E039 Hypothyroidism, unspecified: Secondary | ICD-10-CM

## 2014-01-03 DIAGNOSIS — I2510 Atherosclerotic heart disease of native coronary artery without angina pectoris: Secondary | ICD-10-CM

## 2014-01-03 DIAGNOSIS — K219 Gastro-esophageal reflux disease without esophagitis: Secondary | ICD-10-CM

## 2014-01-03 NOTE — Patient Instructions (Signed)
Your physician recommends that you schedule a follow-up appointment in: 1 year. No changes were made in your therapy today.

## 2014-01-03 NOTE — Progress Notes (Signed)
Patient ID: Jordan Contreras, female   DOB: 1956/02/04, 58 y.o.   MRN: [id number]     HPI: Jordan Contreras is a 58 y.o. female who presents to the office today for a 12 month follow up cardiology evaluation.  Jordan Contreras is a 58 year old female who has a history of premature coronary artery disease and suffered initial myocardial infarction at age 54 and underwent stenting of her LAD in September, 1996.  In 1998 she underwent repeat intervention, secondary to restenosis and, again in May 2008 underwent cutting balloon atherotomy for in-stent restenosis.  She had a long-standing history of tobacco use, but fortunately quit smoking in May 2008.  Additional problems include hypothyroidism, hyperlipidemia, and GERD.  Over the past year, she is continued to be stable.  She denies any recurrent anginal symptoms.  Her last echo Doppler study was done in 2012, which showed an ejection fraction greater than 55% with grade 1 diastolic dysfunction.  Her last nuclear perfusion study was done in March 2012, which remained normal.  She tells me she had laboratory done last week at Dr. Laurian Brim office.  Since Dr. Hardin Negus has been in the hospital, personally himself, she has not had the results and these are not available yet for my review.  Past Medical History  Diagnosis Date  . CAD (coronary artery disease)     premature 04-1995  . 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 Hyperlipidemia   . Thyroid disease   . IBS (irritable bowel syndrome)   . Lyme disease   . Allergic rhinitis     Past Surgical History  Procedure Laterality Date  . Coronary angioplasty with stent placement  1996    stents to the LAD  . Tmj arthroplasty  1980's  . Tubal ligation  12/88  . Coronary angioplasty with stent placement  12/24/1996    PTCA to LAD & diagonal to restenosis at stent site  . Coronary angioplasty with stent placement  01/26/2007    in-stent restenosis LAD  . US echocardiography   10/29/2010    Stage I diastolic function,trace MR & TR.  Marland Kitchen Nm myocar perf wall motion  10/29/2010    normal    Allergies  Allergen Reactions  . Beta Adrenergic Blockers     Extreme fatigue   . Chantix [Varenicline]     Current Outpatient Prescriptions  Medication Sig Dispense Refill  . aspirin 81 MG tablet Take 81 mg by mouth daily.      . Biotin 5000 MCG CAPS Take 1 capsule by mouth daily.      . clopidogrel (PLAVIX) 75 MG tablet Take 1 tablet (75 mg total) by mouth daily.  90 tablet  3  . Coenzyme Q10 (CO Q-10) 200 MG CAPS Take 1 tablet by mouth daily.      Marland Kitchen ezetimibe (ZETIA) 10 MG tablet Take 1 tablet (10 mg total) by mouth daily.  30 tablet  4  . fish oil-omega-3 fatty acids 1000 MG capsule Take 2 g by mouth daily.      Marland Kitchen levothyroxine (SYNTHROID, LEVOTHROID) 50 MCG tablet Take 50 mcg by mouth daily.      . Multiple Vitamin (MULTIVITAMIN) tablet Take 1 tablet by mouth daily.      . pantoprazole (PROTONIX) 40 MG tablet Take 1 tablet (40 mg total) by mouth daily.  90 tablet  3  . pravastatin (PRAVACHOL) 20 MG tablet Take 20 mg by mouth daily.  No current facility-administered medications for this visit.    History   Social History  . Marital Status: Married    Spouse Name: N/A    Number of Children: 3  . Years of Education: N/A   Occupational History  .  [place] History Main Topics  . Smoking status: Former Smoker    Quit date: 12/28/2006  . Smokeless tobacco: Never Used  . Alcohol Use: No  . Drug Use: No  . Sexual Activity: Not on file   Other Topics Concern  . Not on file   Social History Narrative  . No narrative on file    Family History  Problem Relation Age of Onset  . Lung cancer Sister   . Diabetes Sister   . Heart disease Mother   . Heart disease Father     ROS General: Negative; No fevers, chills, or night sweats HEENT: Negative; No changes in vision or hearing, sinus congestion, difficulty swallowing Pulmonary:  Negative; No cough, wheezing, shortness of breath, hemoptysis Cardiovascular: Negative; No chest pain, presyncope, syncope, palpatations GI: Negative; No nausea, vomiting, diarrhea, or abdominal pain GU: Negative; No dysuria, hematuria, or difficulty voiding Musculoskeletal: Negative; no myalgias, joint pain, or weakness Hematologic: Negative; no easy bruising, bleeding Endocrine: Positive for hypothyroidism; no heat/cold intolerance; no diabetes, Neuro: Negative; no changes in balance, headaches Skin: Negative; No rashes or skin lesions Psychiatric: Negative; No behavioral problems, depression Sleep: Negative; No snoring,  daytime sleepiness, hypersomnolence, bruxism, restless legs, hypnogognic hallucinations. Other comprehensive 14 point system review is negative    Physical Exam BP 138/80  Pulse 55  Ht 5\' 3"  (1.6 m)  Wt 142 lb 4.8 oz (64.547 kg)  BMI 25.21 kg/m2 General: Alert, oriented, no distress.  Skin: normal turgor, no rashes, warm and dry HEENT: Normocephalic, atraumatic. Pupils equal round and reactive to light; sclera anicteric; extraocular muscles intact, No lid lag; Nose without nasal septal hypertrophy; Mouth/Parynx benign; Mallinpatti scale 2 Neck: No JVD, no carotid bruits; normal carotid upstroke Lungs: clear to ausculatation and percussion bilaterally; no wheezing or rales, normal inspiratory and expiratory effort Chest wall: without tenderness to palpitation Heart: PMI not displaced, RRR, s1 s2 normal, 1/6 systolic murmur, No diastolic murmur, no rubs, gallops, thrills, or heaves Abdomen: soft, nontender; no hepatosplenomehaly, BS+; abdominal aorta nontender and not dilated by palpation. Back: no CVA tenderness Pulses: 2+  Musculoskeletal: full range of motion, normal strength, no joint deformities Extremities: Pulses 2+, no clubbing cyanosis or edema, Homan's sign negative  Neurologic: grossly nonfocal; Cranial nerves grossly wnl Psychologic: Normal mood and  affect   ECG (independently read by me): Sinus bradycardia at 55 beats per minute.  Normal intervals.  No ectopy.    LABS:  BMET    Component Value Date/Time   NA 140 03/29/2011 2019   K 3.5 03/29/2011 2019   CL 103 03/29/2011 2019   CO2 26 03/29/2011 2019   GLUCOSE 97 03/29/2011 2019   BUN 11 03/29/2011 2019   CREATININE 0.68 03/29/2011 2019   CALCIUM 9.4 03/29/2011 2019   GFRNONAA >60 03/29/2011 2019   GFRAA >60 03/29/2011 2019     Hepatic Function Panel     Component Value Date/Time   PROT 7.2 03/29/2011 2258   ALBUMIN 3.8 03/29/2011 2258   AST 24 03/29/2011 2258   ALT 44* 03/29/2011 2258   ALKPHOS 87 03/29/2011 2258   BILITOT 0.6 03/29/2011 2258   BILIDIR 0.1 03/29/2011 2258   IBILI 0.5 03/29/2011 2258  CBC    Component Value Date/Time   WBC 11.2* 03/29/2011 2019   RBC 4.84 03/29/2011 2019   HGB 14.6 03/29/2011 2019   HCT 41.9 03/29/2011 2019   PLT 326 03/29/2011 2019   MCV 86.6 03/29/2011 2019   MCH 30.2 03/29/2011 2019   MCHC 34.8 03/29/2011 2019   RDW 13.8 03/29/2011 2019   LYMPHSABS 2.7 03/29/2011 2019   MONOABS 1.3* 03/29/2011 2019   EOSABS 0.0 03/29/2011 2019   BASOSABS 0.0 03/29/2011 2019     BNP No results found for this basename: probnp    Lipid Panel  No results found for this basename: chol, trig, hdl, cholhdl, vldl, ldlcalc     RADIOLOGY: No results found.    ASSESSMENT AND PLAN: Jordan Contreras will be turning 58 in several weeks.  She is now 19 years following her myocardial infarction for which she underwent stenting of her LAD in September, 1996, and had 2 overlapping 3.0x15 mm, shod stents.  Her last intervention for in-stent restenosis was in 2008.  She quit smoking at that time.  Subsequently, she has stabilized.  She is tolerating continue dual antiplatelet therapy with aspirin and Plavix without bleeding issues.  She is on pravastatin 20 mg and Zetia 10 mg for hyperlipidemia.  Her levothyroxine to 50 mcg for hypothyroidism.  She's not having any  GERD symptoms.  Her current dose of Protonix.  Her blood pressure today is well controlled.  I will try to obtain the results of the lab work, which was just done at Dr. Laurian Brim office.  If adjustments need to be made to her medical regimen I will contact her.  Otherwise, I will see her back in one year for cardiology reassessment.     Troy Sine, MD, [facility]  01/03/2014 10:18 AM

## 2014-01-06 ENCOUNTER — Other Ambulatory Visit: Payer: Self-pay | Admitting: Internal Medicine

## 2014-01-09 ENCOUNTER — Telehealth: Payer: Self-pay | Admitting: Cardiovascular Disease

## 2014-01-09 ENCOUNTER — Encounter: Payer: Self-pay | Admitting: Cardiovascular Disease

## 2014-01-09 NOTE — Telephone Encounter (Signed)
Dr Lewie Loron office is sending you a copy of her lab work today. She just wanted you to be to be aware of this.

## 2014-01-09 NOTE — Telephone Encounter (Signed)
Already received and place to be scanned for Dr. Evette Georges review.

## 2014-01-17 ENCOUNTER — Other Ambulatory Visit: Payer: Self-pay

## 2014-01-17 MED ORDER — PRAVASTATIN SODIUM 20 MG PO TABS: 20.0000 mg | ORAL_TABLET | Freq: Every day | ORAL | Status: DC

## 2014-01-17 NOTE — Telephone Encounter (Signed)
Rx was sent to pharmacy electronically. 

## 2014-02-03 ENCOUNTER — Other Ambulatory Visit: Payer: Self-pay | Admitting: *Deleted

## 2014-02-03 MED ORDER — EZETIMIBE 10 MG PO TABS: 10.0000 mg | ORAL_TABLET | Freq: Every day | ORAL | Status: DC

## 2014-02-03 NOTE — Telephone Encounter (Signed)
Rx refill sent to patient pharmacy   

## 2014-02-13 ENCOUNTER — Telehealth: Payer: Self-pay | Admitting: Cardiovascular Disease

## 2014-02-13 NOTE — Telephone Encounter (Signed)
Pt encouraged to call PCP for her back spasms, pt. Stated understanding

## 2014-02-13 NOTE — Telephone Encounter (Signed)
Pt is having terrible muscle spasm in her back,this is the third day of it. Is there anything Dr Claiborne Billings can prescribed for this?

## 2014-06-03 ENCOUNTER — Other Ambulatory Visit: Payer: Self-pay | Admitting: Cardiovascular Disease

## 2014-06-04 NOTE — Telephone Encounter (Signed)
Rx was sent to pharmacy electronically. 

## 2014-06-06 ENCOUNTER — Encounter: Payer: Self-pay | Admitting: Cardiovascular Disease

## 2014-06-25 ENCOUNTER — Other Ambulatory Visit: Payer: Self-pay | Admitting: Cardiovascular Disease

## 2014-06-25 NOTE — Telephone Encounter (Signed)
Rx was sent to pharmacy electronically. 

## 2014-08-30 ENCOUNTER — Other Ambulatory Visit: Payer: Self-pay | Admitting: Internal Medicine

## 2014-10-02 ENCOUNTER — Telehealth: Payer: Self-pay | Admitting: Cardiovascular Disease

## 2014-10-02 NOTE — Telephone Encounter (Signed)
Medication samples have been provided to the patient.  Drug name: Leonie Green: 21  LOT: [id number]  Exp.Date: 10/2016  Samples left at front desk for patient pick-up. Patient notified.  Sheral Apley M 5:29 PM 10/02/2014

## 2014-10-02 NOTE — Telephone Encounter (Signed)
Pt called in wanting some samples of Zetia. She has been out of this medication for a month. Please call  Thanks

## 2014-10-24 ENCOUNTER — Other Ambulatory Visit: Payer: Self-pay | Admitting: Cardiovascular Disease

## 2014-10-24 MED ORDER — EZETIMIBE 10 MG PO TABS: 10.0000 mg | ORAL_TABLET | Freq: Every day | ORAL | Status: DC

## 2014-10-24 NOTE — Telephone Encounter (Signed)
Medication samples have been provided to the patient. Drug name: Zetia 10 mg Qty: 14 tabs LOT: [id number] Exp.Date: 05/2016 Drug name: Zetia 10 mg Qty: 7 tabs LOT: [id number] Exp.Date: 07/2016 Drug name: Zetia 10 mg Qty: 7 tabs LOT: [id number] Exp.Date: 10/2016 Samples left at front desk for patient pick-up. Discount card provided. Patient notified.  [place], [place] 4:57 PM 10/24/2014

## 2014-10-24 NOTE — Telephone Encounter (Signed)
Pt would like some samples of Zetia or a discount card or coupons please

## 2014-11-01 ENCOUNTER — Other Ambulatory Visit: Payer: Self-pay | Admitting: Cardiovascular Disease

## 2014-11-17 ENCOUNTER — Other Ambulatory Visit: Payer: Self-pay | Admitting: Cardiovascular Disease

## 2014-11-17 NOTE — Telephone Encounter (Signed)
Rx has been sent to the pharmacy electronically. ° °

## 2014-11-20 ENCOUNTER — Telehealth: Payer: Self-pay | Admitting: Cardiovascular Disease

## 2014-11-20 MED ORDER — EZETIMIBE 10 MG PO TABS: 10.0000 mg | ORAL_TABLET | Freq: Every day | ORAL | Status: DC

## 2014-11-20 NOTE — Telephone Encounter (Signed)
Patient aware samples are at the front desk for pick up  

## 2014-11-20 NOTE — Telephone Encounter (Signed)
Pt called in wanting to speak with Dr. Evette Georges nurse about a discount card she gave her to get her Zetia. The insurance that she has will not except the card. So she would like to know is there a card that will work with her type of insurance or can she receive some samples until further notice. Please call  Thanks

## 2014-11-30 ENCOUNTER — Other Ambulatory Visit: Payer: Self-pay | Admitting: Cardiovascular Disease

## 2014-12-15 ENCOUNTER — Other Ambulatory Visit: Payer: Self-pay | Admitting: Cardiovascular Disease

## 2014-12-15 NOTE — Telephone Encounter (Signed)
Pravastatin refilled #90 11/17/14

## 2015-01-05 ENCOUNTER — Other Ambulatory Visit: Payer: Self-pay | Admitting: Cardiovascular Disease

## 2015-01-09 ENCOUNTER — Other Ambulatory Visit: Payer: Self-pay | Admitting: Cardiovascular Disease

## 2015-01-09 NOTE — Telephone Encounter (Signed)
Pt would like some samples of Zetia please. °

## 2015-01-12 NOTE — Telephone Encounter (Signed)
No answer when dialed. 

## 2015-01-15 ENCOUNTER — Telehealth: Payer: Self-pay | Admitting: Cardiovascular Disease

## 2015-01-15 NOTE — Telephone Encounter (Signed)
Pt would like some samples of Zetia please. °

## 2015-01-15 NOTE — Telephone Encounter (Signed)
Spoke with pt, aware no samples available. She reports she can not afford it and will not be taking it without samples. She will call back

## 2015-01-20 ENCOUNTER — Ambulatory Visit (INDEPENDENT_AMBULATORY_CARE_PROVIDER_SITE_OTHER): Payer: 59 | Admitting: Cardiovascular Disease

## 2015-01-20 ENCOUNTER — Encounter: Payer: Self-pay | Admitting: Cardiovascular Disease

## 2015-01-20 VITALS — BP 140/88 | HR 52 | Ht 63.0 in | Wt 146.4 lb

## 2015-01-20 DIAGNOSIS — I2510 Atherosclerotic heart disease of native coronary artery without angina pectoris: Secondary | ICD-10-CM

## 2015-01-20 DIAGNOSIS — E785 Hyperlipidemia, unspecified: Secondary | ICD-10-CM | POA: Diagnosis not present

## 2015-01-20 DIAGNOSIS — E039 Hypothyroidism, unspecified: Secondary | ICD-10-CM

## 2015-01-20 MED ORDER — ATORVASTATIN CALCIUM 20 MG PO TABS
20.0000 mg | ORAL_TABLET | Freq: Every day | ORAL | Status: DC
Start: 2015-01-20 — End: 2015-12-26

## 2015-01-20 NOTE — Patient Instructions (Signed)
Your physician wants you to follow-up in: 1 Year You will receive a reminder letter in the mail two months in advance. If you don't receive a letter, please call our office to schedule the follow-up appointment.  Your physician has recommended you make the following change in your medication: STOP ZETIA and PRAVASTATIN and START ATORVASTATIN 20 mg daily

## 2015-01-22 ENCOUNTER — Encounter: Payer: Self-pay | Admitting: Cardiovascular Disease

## 2015-01-22 NOTE — Progress Notes (Signed)
Patient ID: Jordan Contreras, female   DOB: 11/26/1955, 59 y.o.   MRN: [id number]     HPI: Jordan Contreras is a 59 y.o. female who presents to the office today for a 12 month follow up cardiology evaluation.  Jordan Contreras  has a history of premature CAD and suffered initial myocardial infarction at age 72 and underwent stenting of her LAD in September, 1996.  In 1998 she underwent repeat intervention, secondary to restenosis and, again in May 2008 underwent cutting balloon atherotomy for in-stent restenosis.  She had a long-standing history of tobacco use, but fortunately quit smoking in May 2008.  Additional problems include hypothyroidism, hyperlipidemia, and GERD.  Over the past year, she  denies any recurrent anginal symptoms.  Her last echo Doppler study in 2012,  showed an ejection fraction greater than 55% with grade 1 diastolic dysfunction.  Her last nuclear perfusion study  in March 2012 was normal.  Over the past year.  She had been on Pravachol 20 mg in addition to Zetia 10 mg.  However, her insurance is no longer covering Zetia and due to the expense.  She is been off this for a month.  She continues to take Synthroid 50 g for hypothyroidism, Protonix 40 mg for GERD.  She continues with aspirin and Plavix for dual antiplatelets therapy and also has been taking fish oil.  Past Medical History  Diagnosis Date  . CAD (coronary artery disease)     premature 04-1995  . 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 Hyperlipidemia   . Thyroid disease   . IBS (irritable bowel syndrome)   . Lyme disease   . Allergic rhinitis     Past Surgical History  Procedure Laterality Date  . Coronary angioplasty with stent placement  1996    stents to the LAD  . Tmj arthroplasty  1980's  . Tubal ligation  12/88  . Coronary angioplasty with stent placement  12/24/1996    PTCA to LAD & diagonal to restenosis at stent site  . Coronary angioplasty with stent placement  01/26/2007   in-stent restenosis LAD  . US echocardiography  10/29/2010    Stage I diastolic function,trace MR & TR.  Marland Kitchen Nm myocar perf wall motion  10/29/2010    normal    Allergies  Allergen Reactions  . Beta Adrenergic Blockers     Extreme fatigue   . Chantix [Varenicline]     Current Outpatient Prescriptions  Medication Sig Dispense Refill  . aspirin 81 MG tablet Take 81 mg by mouth daily.    . Biotin 5000 MCG CAPS Take 1 capsule by mouth daily.    . Calcium Carbonate-Vitamin D (CALCIUM 600+D PO) Take 1 capsule by mouth daily.    . clopidogrel (PLAVIX) 75 MG tablet TAKE 1 TABLET BY MOUTH EVERY DAY 90 tablet 0  . Coenzyme Q10 (CO Q-10) 200 MG CAPS Take 1 tablet by mouth daily.    . fish oil-omega-3 fatty acids 1000 MG capsule Take 2 g by mouth daily.    Marland Kitchen levothyroxine (SYNTHROID, LEVOTHROID) 50 MCG tablet Take 50 mcg by mouth daily.    Marland Kitchen LINZESS 145 MCG CAPS capsule TAKE ONE CAPSULE EVERY DAY 30 capsule 1  . Multiple Vitamin (MULTIVITAMIN) tablet Take 1 tablet by mouth daily.    . pantoprazole (PROTONIX) 40 MG tablet Take 1 tablet (40 mg total) by mouth daily. 90 tablet 3  . atorvastatin (LIPITOR) 20 MG tablet Take  1 tablet (20 mg total) by mouth daily. 30 tablet 11   No current facility-administered medications for this visit.    History   Social History  . Marital Status: Married    Spouse Name: N/A  . Number of Children: 3  . Years of Education: N/A   Occupational History  .  [place] History Main Topics  . Smoking status: Former Smoker    Quit date: 12/28/2006  . Smokeless tobacco: Never Used  . Alcohol Use: No  . Drug Use: No  . Sexual Activity: Not on file   Other Topics Concern  . Not on file   Social History Narrative    Family History  Problem Relation Age of Onset  . Lung cancer Sister   . Diabetes Sister   . Heart disease Mother   . Heart disease Father     ROS General: Negative; No fevers, chills, or night sweats HEENT: Negative; No  changes in vision or hearing, sinus congestion, difficulty swallowing Pulmonary: Negative; No cough, wheezing, shortness of breath, hemoptysis Cardiovascular: Negative; No chest pain, presyncope, syncope, palpatations GI: Negative; No nausea, vomiting, diarrhea, or abdominal pain GU: Negative; No dysuria, hematuria, or difficulty voiding Musculoskeletal: Negative; no myalgias, joint pain, or weakness Hematologic: Negative; no easy bruising, bleeding Endocrine: Positive for hypothyroidism; no heat/cold intolerance; no diabetes, Neuro: Negative; no changes in balance, headaches Skin: Negative; No rashes or skin lesions Psychiatric: Negative; No behavioral problems, depression Sleep: Negative; No snoring,  daytime sleepiness, hypersomnolence, bruxism, restless legs, hypnogognic hallucinations. Other comprehensive 14 point system review is negative    Physical Exam BP 140/88 mmHg  Pulse 52  Ht $R'5\' 3"'Yc$  (1.6 m)  Wt 146 lb 6.4 oz (66.407 kg)  BMI 25.94 kg/m2   Wt Readings from Last 3 Encounters:  01/20/15 146 lb 6.4 oz (66.407 kg)  01/03/14 142 lb 4.8 oz (64.547 kg)  06/12/12 137 lb (62.143 kg)   General: Alert, oriented, no distress.  Skin: normal turgor, no rashes, warm and dry HEENT: Normocephalic, atraumatic. Pupils equal round and reactive to light; sclera anicteric; extraocular muscles intact, No lid lag; Nose without nasal septal hypertrophy; Mouth/Parynx benign; Mallinpatti scale 2 Neck: No JVD, no carotid bruits; normal carotid upstroke Lungs: clear to ausculatation and percussion bilaterally; no wheezing or rales, normal inspiratory and expiratory effort Chest wall: without tenderness to palpitation Heart: PMI not displaced, RRR, s1 s2 normal, 1/6 systolic murmur, No diastolic murmur, no rubs, gallops, thrills, or heaves Abdomen: soft, nontender; no hepatosplenomehaly, BS+; abdominal aorta nontender and not dilated by palpation. Back: no CVA tenderness Pulses: 2+    Musculoskeletal: full range of motion, normal strength, no joint deformities Extremities: Pulses 2+, no clubbing cyanosis or edema, Homan's sign negative  Neurologic: grossly nonfocal; Cranial nerves grossly wnl Psychologic: Normal mood and affect  ECG (independently read by me): Sinus bradycardia 52 bpm.  Normal intervals.  No ectopy.   May 2015 ECG (independently read by me): Sinus bradycardia at 55 beats per minute.  Normal intervals.  No ectopy.    LABS:  BMP Latest Ref Rng 03/29/2011  Glucose 70 - 99 mg/dL 97  BUN 6 - 23 mg/dL 11  Creatinine 0.50 - 1.10 mg/dL 0.68  Sodium 135 - 145 mEq/L 140  Potassium 3.5 - 5.1 mEq/L 3.5  Chloride 96 - 112 mEq/L 103  CO2 19 - 32 mEq/L 26  Calcium 8.4 - 10.5 mg/dL 9.4   Hepatic Function Latest Ref Rng 03/29/2011  Total Protein  6.0 - 8.3 g/dL 7.2  Albumin 3.5 - 5.2 g/dL 3.8  AST 0 - 37 U/L 24  ALT 0 - 35 U/L 44(H)  Alk Phosphatase 39 - 117 U/L 87  Total Bilirubin 0.3 - 1.2 mg/dL 0.6  Bilirubin, Direct 0.0 - 0.3 mg/dL 0.1   CBC Latest Ref Rng 03/29/2011  WBC 4.0 - 10.5 K/uL 11.2(H)  Hemoglobin 12.0 - 15.0 g/dL 14.6  Hematocrit 36.0 - 46.0 % 41.9  Platelets 150 - 400 K/uL 326   No results found for: TSH  Lipid Panel  No results found for: CHOL, TRIG, HDL, CHOLHDL, VLDL, LDLCALC, LDLDIRECT   RADIOLOGY: No results found.    ASSESSMENT AND PLAN: Jordan Contreras is a 59 year old female who is almost 20 years years following her myocardial infarction for which she underwent stenting of her LAD in September, 1996, with overlapping PS  3.015 mm stents.  Her last intervention for in-stent restenosis was in 2008.  She quit smoking at that time.  Subsequently, she has stabilized.  She is tolerating continue dual antiplatelet therapy with aspirin and Plavix without bleeding issues.  She states she cannot afford her current Zetia and has been on low-dose pravastatin.  At this time, I am recommending she discontinue pravastatin and I will  start her on atorvastatin 20 mg, which will be equivalent to a pravastatin 80 mg dose.  She will take coenzyme Q10 100-200 mg daily, which hopefully will prevent myalgias.  She will continue with her current dose of levothyroxine.  I am recommending complete set of laboratory be obtained, and she tells me this will be done by Dr. Laurian Brim.  I will ask that they be sent to our office for my review.  As long as she is stable, I will see her one year for reevaluation  Time spent: 25 minutes  Troy Sine, MD, [facility]  01/22/2015 9:38 PM

## 2015-02-03 ENCOUNTER — Other Ambulatory Visit: Payer: Self-pay | Admitting: Cardiovascular Disease

## 2015-02-03 NOTE — Telephone Encounter (Signed)
Rx has been sent to the pharmacy electronically. ° °

## 2015-10-29 ENCOUNTER — Other Ambulatory Visit: Payer: Self-pay | Admitting: Cardiovascular Disease

## 2015-10-29 NOTE — Telephone Encounter (Signed)
REFILL 

## 2015-12-26 ENCOUNTER — Other Ambulatory Visit: Payer: Self-pay | Admitting: Cardiovascular Disease

## 2016-01-01 ENCOUNTER — Telehealth: Payer: Self-pay | Admitting: Cardiovascular Disease

## 2016-01-08 NOTE — Telephone Encounter (Signed)
Closed Encounter  °

## 2016-01-16 ENCOUNTER — Other Ambulatory Visit: Payer: Self-pay | Admitting: Cardiovascular Disease

## 2016-01-18 NOTE — Telephone Encounter (Signed)
Rx(s) sent to pharmacy electronically.  

## 2016-04-15 ENCOUNTER — Other Ambulatory Visit: Payer: Self-pay | Admitting: Cardiovascular Disease

## 2016-04-15 NOTE — Telephone Encounter (Signed)
Rx request sent to pharmacy.  

## 2016-05-18 ENCOUNTER — Other Ambulatory Visit: Payer: Self-pay | Admitting: Cardiovascular Disease

## 2016-05-18 NOTE — Telephone Encounter (Signed)
Rx(s) sent to pharmacy electronically.  

## 2016-06-02 ENCOUNTER — Other Ambulatory Visit: Payer: Self-pay | Admitting: Cardiovascular Disease

## 2016-07-10 ENCOUNTER — Other Ambulatory Visit: Payer: Self-pay | Admitting: Cardiovascular Disease

## 2017-02-07 ENCOUNTER — Encounter: Payer: Self-pay | Admitting: *Deleted

## 2017-06-14 ENCOUNTER — Encounter: Payer: Self-pay | Admitting: Internal Medicine

## 2017-11-14 ENCOUNTER — Telehealth: Payer: Self-pay | Admitting: Cardiovascular Disease

## 2017-11-14 NOTE — Telephone Encounter (Signed)
New message  Melissa calling from Dr Clovis Riley office. Advised caller to have patient call and schedule appointment.         [facility] Pre-operative Risk Assessment    Request for surgical clearance:  1. What type of surgery is being performed? Dental extraction  2. When is this surgery scheduled? 11/28/2017  3. What type of clearance is required (medical clearance vs. Pharmacy clearance to hold med vs. Both)?Both  4. Are there any medications that need to be held prior to surgery and how long?n/a  5. Practice name and name of physician performing surgery? Dr Donah Driver  6. What is your office phone and fax number? Fax [phone], phone [phone]  7. Anesthesia type (None, local, MAC, general) ? local   Laurier Nancy 11/14/2017, 3:17 PM  _________________________________________________________________   (provider comments below)

## 2017-11-16 NOTE — Telephone Encounter (Signed)
   Primary Cardiologist:No primary care provider on file.  Chart reviewed as part of pre-operative protocol coverage. Because of Jordan Contreras's past medical history and time since last visit, Jordan Contreras will require a follow-up visit in order to better assess preoperative cardiovascular risk.  The patient has been seen for cardiology at [facility] in [place] in 2017. She was previously seen at our office by Dr. Claiborne Billings in 2016. She should get clearance at [facility] if that is where she is now followed.   Pre-op covering staff: - Please schedule appointment and call patient to inform them. - Please contact requesting surgeon's office via preferred method (i.e, phone, fax) to inform them of need for appointment prior to surgery.  Daune Perch, NP  11/16/2017, 2:44 PM

## 2017-11-16 NOTE — Telephone Encounter (Signed)
Called pt re: Surgical Clearance. Pt hasn't been seen since 2016 and has seen Cardiology in [place] since her appt here. Per Pecolia Ades, PA-C, if pt would like for [place] to do this clearance, she will need to be seen 1st.  If she would like for her other Cardiologist to do it, she will need to have her surgeon send them the clearance.  Left pt a message to call back 1:30-5:00 M-F and ask for the [place]

## 2017-11-17 NOTE — Telephone Encounter (Signed)
Lmtcb x 2 in regards to her upcoming surgery. Pt will need appt since last seen 12/2014 with Dr. Claiborne Billings. See previous note as well, pt see's Cardiologist in [place]. If pt would like our office to clear her for surgery she will need an appt or if she chooses clearance done by Cardiologist in [place] she will then need to let Dr. Georgina Snell office know where to send clearance. I will touch base with Dr. Georgina Snell office today as well as an FYI.

## 2017-11-21 NOTE — Telephone Encounter (Signed)
Left message for pt to call back  °

## 2017-11-22 NOTE — Telephone Encounter (Signed)
Pre-Op Call back pool has made numerous attempts to reach the pt in regards to her upcoming surgery. See previous notes.

## 2017-11-22 NOTE — Telephone Encounter (Signed)
Pre-Op Call Back Pool has attempted numerous times to reach pt, with no call back from the pt. I will at this time remove this from the Call back pool .

## 2017-11-23 ENCOUNTER — Telehealth: Payer: Self-pay | Admitting: Cardiovascular Disease

## 2017-11-23 NOTE — Telephone Encounter (Signed)
Follow up   Patient called back she is no longer a patient of [facility], her dental office sent request to wrong office.  She has been in contact with them and had it transferred to the proper Doctors office.   Taking this encounter out of the system.

## 2020-10-27 ENCOUNTER — Inpatient Hospital Stay: Payer: Medicare Other | Attending: Oncology | Admitting: Licensed Clinical Social Worker

## 2020-10-27 DIAGNOSIS — Z803 Family history of malignant neoplasm of breast: Secondary | ICD-10-CM

## 2020-10-27 DIAGNOSIS — Z801 Family history of malignant neoplasm of trachea, bronchus and lung: Secondary | ICD-10-CM

## 2020-10-29 ENCOUNTER — Encounter: Payer: Self-pay | Admitting: Licensed Clinical Social Worker

## 2020-10-29 DIAGNOSIS — Z803 Family history of malignant neoplasm of breast: Secondary | ICD-10-CM | POA: Insufficient documentation

## 2020-10-29 DIAGNOSIS — Z801 Family history of malignant neoplasm of trachea, bronchus and lung: Secondary | ICD-10-CM | POA: Insufficient documentation

## 2020-10-29 NOTE — Progress Notes (Addendum)
REFERRING PROVIDER: Self-referred   PRIMARY PROVIDER:  Morton Peters., MD  PRIMARY REASON FOR VISIT:  1. Family history of lung cancer   2. Family history of breast cancer      HISTORY OF PRESENT ILLNESS:   Jordan Contreras a 65 y.o. female, was seen for a [place] cancer genetics consultation  due to a family history of cancer.  Jordan Contreras presents to clinic today to discuss the possibility of a hereditary predisposition to cancer, genetic testing, and to further clarify her future cancer risks, as well as potential cancer risks for family members.    Jordan Contreras is a 65 y.o. female with no personal history of cancer.    CANCER HISTORY:  Oncology History   No history exists.    Past Medical History:  Diagnosis Date  . Allergic rhinitis   . CAD (coronary artery disease)    premature 04-1995  . Family history of breast cancer   . Family history of lung cancer   . Hepatitis    1982 caused by water or fish  . Hyperlipidemia   . IBS (irritable bowel syndrome)   . Lyme disease   . Thyroid disease     Past Surgical History:  Procedure Laterality Date  . CORONARY ANGIOPLASTY WITH STENT PLACEMENT  1996   stents to the LAD  . CORONARY ANGIOPLASTY WITH STENT PLACEMENT  12/24/1996   PTCA to LAD & diagonal to restenosis at stent site  . CORONARY ANGIOPLASTY WITH STENT PLACEMENT  01/26/2007   in-stent restenosis LAD  . NM MYOCAR PERF WALL MOTION  10/29/2010   normal  . TMJ ARTHROPLASTY  1980's  . TUBAL LIGATION  12/88  . US ECHOCARDIOGRAPHY  10/29/2010   Stage I diastolic function,trace MR & TR.    Social History   Socioeconomic History  . Marital status: Married    Spouse name: Not on file  . Number of children: 3  . Years of education: Not on file  . Highest education level: Not on file  Occupational History    Employer: [employer]  Tobacco Use  . Smoking status: Former Smoker    Quit date: 12/28/2006    Years since quitting: 13.8  . Smokeless tobacco: Never Used   Substance and Sexual Activity  . Alcohol use: No    Alcohol/week: 0.0 standard drinks  . Drug use: No  . Sexual activity: Not on file  Other Topics Concern  . Not on file  Social History Narrative  . Not on file   Social Determinants of Health   Financial Resource Strain: Not on file  Food Insecurity: Not on file  Transportation Needs: Not on file  Physical Activity: Not on file  Stress: Not on file  Social Connections: Not on file     FAMILY HISTORY:  We obtained a detailed, 4-generation family history.  Significant diagnoses are listed below: Family History  Problem Relation Age of Onset  . Lung cancer Sister   . Heart disease Mother   . Heart disease Father   . Diabetes Sister   . Cancer Maternal Uncle        jaw  . Breast cancer Paternal Aunt 33  . Breast cancer Daughter 35   Jordan Contreras has 2 daughters, 1 son. Her daughter, Leana Roe, recently passed at age 23 due to metastatic breast cancer. She did not have genetic testing. Patient had 3 sisters. One passed from lung cancer.   Jordan Contreras mother passed at 80,  father passed at 24. She had a maternal uncle who had jaw cancer and paternal aunt with breast cancer at 66.   Jordan Contreras is unaware of previous family history of genetic testing for hereditary cancer risks. Patient's maternal ancestors are of unknown descent, and paternal ancestors are of unknown descent. There no reported Ashkenazi Jewish ancestry. There no known consanguinity.  GENETIC COUNSELING ASSESSMENT: Jordan Contreras is a 65 y.o. female with a family history of breast cancer which is somewhat suggestive of a hereditary cancer syndrome and predisposition to cancer. We, therefore, discussed and recommended the following at today's visit.   DISCUSSION: We discussed that approximately 5-10% of breast cancer is hereditary  Most cases of hereditary breast cancer are associated with BRCA1/BRCA2 genes, although there are other genes associated with hereditary breast cancer as  well.  We discussed that testing is beneficial for several reasons including knowing about cancer risks, identifying potential screening and risk-reduction options that may be appropriate, and to understand if other family members could be at risk for cancer and allow them to undergo genetic testing.   We reviewed the characteristics, features and inheritance patterns of hereditary cancer syndromes. We also discussed genetic testing, including the appropriate family members to test, the process of testing, insurance coverage and turn-around-time for results. We discussed the implications of a negative, positive and/or variant of uncertain significant result. We recommended Jordan Contreras pursue genetic testing for the [facility] Multi-Cancer gene panel.   The Multi-Cancer Panel+RNA offered by [facility] includes sequencing and/or deletion duplication testing of the following 85 genes: AIP, ALK, APC, ATM, AXIN2,BAP1,  BARD1, BLM, BMPR1A, BRCA1, BRCA2, BRIP1, CASR, CDC73, CDH1, CDK4, CDKN1B, CDKN1C, CDKN2A (p14ARF), CDKN2A (p16INK4a), CEBPA, CHEK2, CTNNA1, DICER1, DIS3L2, EGFR (c.2369C>T, p.Thr790Met variant only), EPCAM (Deletion/duplication testing only), FH, FLCN, GATA2, GPC3, GREM1 (Promoter region deletion/duplication testing only), HOXB13 (c.251G>A, p.Gly84Glu), HRAS, KIT, MAX, MEN1, MET, MITF (c.952G>A, p.Glu318Lys variant only), MLH1, MSH2, MSH3, MSH6, MUTYH, NBN, NF1, NF2, NTHL1, PALB2, PDGFRA, PHOX2B, PMS2, POLD1, POLE, POT1, PRKAR1A, PTCH1, PTEN, RAD50, RAD51C, RAD51D, RB1, RECQL4, RET, RNF43, RUNX1, SDHAF2, SDHA (sequence changes only), SDHB, SDHC, SDHD, SMAD4, SMARCA4, SMARCB1, SMARCE1, STK11, SUFU, TERC, TERT, TMEM127, TP53, TSC1, TSC2, VHL, WRN and WT1.   Based on Jordan Contreras's family history of cancer, she meets medical criteria for genetic testing. Despite that she meets criteria, she may still have an out of pocket cost. We discussed that if her out of pocket cost for testing is over $100, the laboratory will  call and confirm whether she wants to proceed with testing.  If the out of pocket cost of testing is less than $100 she will be billed by the genetic testing laboratory.   We discussed that some people do not want to undergo genetic testing due to fear of genetic discrimination.  A federal law called the Genetic Information Non-Discrimination Act (GINA) of 2008 helps protect individuals against genetic discrimination based on their genetic test results.  It impacts both health insurance and employment.  For health insurance, it protects against increased premiums, being kicked off insurance or being forced to take a test in order to be insured.  For employment it protects against hiring, firing and promoting decisions based on genetic test results.  Health status due to a cancer diagnosis is not protected under GINA.  This law does not protect life insurance, disability insurance, or other types of insurance.   PLAN: After considering the risks, benefits, and limitations, Jordan Contreras provided informed consent to pursue genetic testing.  She will return for a blood draw for her son's appointment on 3/10 and the blood sample will be sent to [facility] for analysis of the Multi-Cancer Panel. Results should be available within approximately 2-3 weeks' time, at which point they will be disclosed by telephone to Jordan Contreras as will any additional recommendations warranted by these results. Jordan Contreras will receive a summary of her genetic counseling visit and a copy of her results once available. This information will also be available in Epic.   Jordan Contreras questions were answered to her satisfaction today. Our contact information was provided should additional questions or concerns arise. Thank you for the referral and allowing [place] to share in the care of your patient.   Faith Rogue, MS, [facility] Genetic Counselor [email or web address] Phone: [phone]  The patient was seen for a total of 35 minutes  in face-to-face genetic counseling. Patient's daughter, Baxter Flattery, was also present.  Dr. Grayland Ormond was available for discussion regarding this case.   _______________________________________________________________________ For Office Staff:  Number of people involved in session: 2 Was an Intern/ student involved with case: no

## 2020-11-05 ENCOUNTER — Encounter: Payer: Self-pay | Admitting: Licensed Clinical Social Worker

## 2020-11-05 ENCOUNTER — Inpatient Hospital Stay: Payer: Medicare Other

## 2020-11-23 ENCOUNTER — Ambulatory Visit: Payer: Self-pay | Admitting: Licensed Clinical Social Worker

## 2020-11-23 ENCOUNTER — Encounter: Payer: Self-pay | Admitting: Licensed Clinical Social Worker

## 2020-11-23 ENCOUNTER — Telehealth: Payer: Self-pay | Admitting: Licensed Clinical Social Worker

## 2020-11-23 DIAGNOSIS — Z1379 Encounter for other screening for genetic and chromosomal anomalies: Secondary | ICD-10-CM | POA: Insufficient documentation

## 2020-11-23 DIAGNOSIS — Z803 Family history of malignant neoplasm of breast: Secondary | ICD-10-CM

## 2020-11-23 NOTE — Progress Notes (Signed)
HPI:  Ms. Nies was previously seen in the [place] clinic due to a family history of cancer and concerns regarding a hereditary predisposition to cancer. Please refer to our prior cancer genetics clinic note for more information regarding our discussion, assessment and recommendations, at the time. Ms. Mcguffin recent genetic test results were disclosed to her, as were recommendations warranted by these results. These results and recommendations are discussed in more detail below.  CANCER HISTORY:  Oncology History   No history exists.    FAMILY HISTORY:  We obtained a detailed, 4-generation family history.  Significant diagnoses are listed below: Family History  Problem Relation Age of Onset  . Lung cancer Sister   . Heart disease Mother   . Heart disease Father   . Diabetes Sister   . Cancer Maternal Uncle        jaw  . Breast cancer Paternal Aunt 63  . Breast cancer Daughter 76   Ms. Bigos has 2 daughters, 1 son. Her daughter, Leana Roe, recently passed at age 66 due to metastatic breast cancer. She did not have genetic testing. Patient had 3 sisters. One passed from lung cancer.   Ms. Tucci mother passed at 49, father passed at 45. She had a maternal uncle who had jaw cancer and paternal aunt with breast cancer at 35.   Ms. Sarchet is unaware of previous family history of genetic testing for hereditary cancer risks. Patient's maternal ancestors are of unknown descent, and paternal ancestors are of unknown descent. There no reported Ashkenazi Jewish ancestry. There no known consanguinity.   GENETIC TEST RESULTS: Genetic testing reported out on 11/21/2020 through the [facility] Multi-Cancer+RNA cancer panel found no pathogenic mutations.   The Multi-Cancer Panel + RNA offered by [facility] includes sequencing and/or deletion duplication testing of the following 84 genes: AIP, ALK, APC, ATM, AXIN2,BAP1,  BARD1, BLM, BMPR1A, BRCA1, BRCA2, BRIP1, CASR, CDC73, CDH1, CDK4, CDKN1B,  CDKN1C, CDKN2A (p14ARF), CDKN2A (p16INK4a), CEBPA, CHEK2, CTNNA1, DICER1, DIS3L2, EGFR (c.2369C>T, p.Thr790Met variant only), EPCAM (Deletion/duplication testing only), FH, FLCN, GATA2, GPC3, GREM1 (Promoter region deletion/duplication testing only), HOXB13 (c.251G>A, p.Gly84Glu), HRAS, KIT, MAX, MEN1, MET, MITF (c.952G>A, p.Glu318Lys variant only), MLH1, MSH2, MSH3, MSH6, MUTYH, NBN, NF1, NF2, NTHL1, PALB2, PDGFRA, PHOX2B, PMS2, POLD1, POLE, POT1, PRKAR1A, PTCH1, PTEN, RAD50, RAD51C, RAD51D, RB1, RECQL4, RET, RUNX1, SDHAF2, SDHA (sequence changes only), SDHB, SDHC, SDHD, SMAD4, SMARCA4, SMARCB1, SMARCE1, STK11, SUFU, TERC, TERT, TMEM127, TP53, TSC1, TSC2, VHL, WRN and WT1.   The test report has been scanned into EPIC and is located under the Molecular Pathology section of the Results Review tab.  A portion of the result report is included below for reference.     We discussed with Ms. Stangeland that because current genetic testing is not perfect, it is possible there may be a gene mutation in one of these genes that current testing cannot detect, but that chance is small.  We also discussed, that there could be another gene that has not yet been discovered, or that we have not yet tested, that is responsible for the cancer diagnoses in the family. It is also possible there is a hereditary cause for the cancer in the family that Ms. Karpel did not inherit and therefore was not identified in her testing.  Therefore, it is important to remain in touch with cancer genetics in the future so that we can continue to offer Ms. Cawood the most up to date genetic testing.    ADDITIONAL GENETIC TESTING: We discussed  with Ms. Guilfoil that her genetic testing was fairly extensive.  If there are genes identified to increase cancer risk that can be analyzed in the future, we would be happy to discuss and coordinate this testing at that time.    CANCER SCREENING RECOMMENDATIONS: Ms. Duggar test result is considered negative  (normal).  This means that we have not identified a hereditary cause for her  family history of cancer at this time.   While reassuring, this does not definitively rule out a hereditary predisposition to cancer. It is still possible that there could be genetic mutations that are undetectable by current technology. There could be genetic mutations in genes that have not been tested or identified to increase cancer risk.  Therefore, it is recommended she continue to follow the cancer management and screening guidelines provided by her primary healthcare provider.   An individual's cancer risk and medical management are not determined by genetic test results alone. Overall cancer risk assessment incorporates additional factors, including personal medical history, family history, and any available genetic information that may result in a personalized plan for cancer prevention and surveillance.  RECOMMENDATIONS FOR FAMILY MEMBERS:  Relatives in this family might be at some increased risk of developing cancer, over the general population risk, simply due to the family history of cancer.  We recommended female relatives in this family have a yearly mammogram beginning at age 24, or 25 years younger than the earliest onset of cancer, an annual clinical breast exam, and perform monthly breast self-exams. Female relatives in this family should also have a gynecological exam as recommended by their primary provider.  All family members should be referred for colonoscopy starting at age 19.    It is also possible there is a hereditary cause for the cancer in Ms. Cayton's family that she did not inherit and therefore was not identified in her.  Based on Ms. Dumond's family history, we recommended her granddaughter have genetic counseling and testing once she is older. Ms. Sol will let us know if we can be of any assistance in coordinating genetic counseling and/or testing for these family members.  FOLLOW-UP: Lastly, we  discussed with Ms. Migues that cancer genetics is a rapidly advancing field and it is possible that new genetic tests will be appropriate for her and/or her family members in the future. We encouraged her to remain in contact with cancer genetics on an annual basis so we can update her personal and family histories and let her know of advances in cancer genetics that may benefit this family.   Our contact number was provided. Ms. Wuest questions were answered to her satisfaction, and she knows she is welcome to call us at anytime with additional questions or concerns.   Faith Rogue, MS, [facility] Genetic Counselor [email or web address] Phone: [phone]

## 2020-11-23 NOTE — Telephone Encounter (Signed)
Revealed negative genetic testing.  This normal result is reassuring.  It is unlikely that there is an increased risk of cancer due to a mutation in one of these genes.  However, genetic testing is not perfect, and cannot definitively rule out a hereditary cause.  It will be important for her to keep in contact with genetics to learn if any additional testing may be needed in the future.      

## 2022-02-16 ENCOUNTER — Ambulatory Visit (INDEPENDENT_AMBULATORY_CARE_PROVIDER_SITE_OTHER): Payer: Medicare Other | Admitting: Cardiovascular Disease

## 2022-02-16 ENCOUNTER — Encounter: Payer: Self-pay | Admitting: Cardiovascular Disease

## 2022-02-16 VITALS — BP 111/72 | HR 61 | Ht 63.0 in | Wt 149.2 lb

## 2022-02-16 DIAGNOSIS — F419 Anxiety disorder, unspecified: Secondary | ICD-10-CM

## 2022-02-16 DIAGNOSIS — I2510 Atherosclerotic heart disease of native coronary artery without angina pectoris: Secondary | ICD-10-CM

## 2022-02-16 DIAGNOSIS — E785 Hyperlipidemia, unspecified: Secondary | ICD-10-CM | POA: Diagnosis not present

## 2022-02-16 DIAGNOSIS — I252 Old myocardial infarction: Secondary | ICD-10-CM | POA: Diagnosis not present

## 2022-02-16 DIAGNOSIS — Z9861 Coronary angioplasty status: Secondary | ICD-10-CM

## 2022-02-16 DIAGNOSIS — E039 Hypothyroidism, unspecified: Secondary | ICD-10-CM

## 2022-02-16 MED ORDER — LISINOPRIL 2.5 MG PO TABS: 2.5000 mg | ORAL_TABLET | Freq: Every morning | ORAL | 3 refills | Status: DC

## 2022-02-16 NOTE — Patient Instructions (Addendum)
Medication Instructions:  DECREASE Lisinopril to 2.5 mg daily. Monitor blood pressure at home if Systolic (Top number) of blood pressure is consisently greater than 135 go back to taking 5 mg daily of Lisinopril.  *If you need a refill on your cardiac medications before your next appointment, please call your pharmacy*  Lab Work: Your physician recommends that you return for lab work at your earliest:  CBC CMP Fasting Lipid Panel-DO NOT eat or drink past midnight. Okay to have water. LP(a) TSH If you have labs (blood work) drawn today and your tests are completely normal, you will receive your results only by: [place] (if you have MyChart) OR A paper copy in the mail If you have any lab test that is abnormal or we need to change your treatment, we will call you to review the results.  Testing/Procedures: Your physician has requested that you have an echocardiogram. Echocardiography is a painless test that uses sound waves to create images of your heart. It provides your doctor with information about the size and shape of your heart and how well your heart's chambers and valves are working. This procedure takes approximately one hour. There are no restrictions for this procedure.    Follow-Up: At [facility], you and your health needs are our priority.  As part of our continuing mission to provide you with exceptional heart care, we have created designated Provider Care Teams.  These Care Teams include your primary Cardiologist (physician) and Advanced Practice Providers (APPs -  Physician Assistants and Nurse Practitioners) who all work together to provide you with the care you need, when you need it.  We recommend signing up for the patient portal called "MyChart".  Sign up information is provided on this After Visit Summary.  MyChart is used to connect with patients for Virtual Visits (Telemedicine).  Patients are able to view lab/test results, encounter notes, upcoming  appointments, etc.  Non-urgent messages can be sent to your provider as well.   To learn more about what you can do with MyChart, go to [email or web address].    Your next appointment:   4 month(s)  The format for your next appointment:   In Person  Provider:   Shelva Majestic, MD     Other Instructions   Important Information About Sugar

## 2022-02-16 NOTE — Progress Notes (Unsigned)
Cardiology Office Note    Date:  02/16/2022   ID:  Jordan Contreras, Jordan Contreras 1956-02-12, MRN [id number]  PCP:  Morton Peters., MD  Cardiologist:  Shelva Majestic, MD    Reestablishment of cardiology care now that she is moved back to the area from [place], [place].  Last seen by me Jan 20, 2015  History of Present Illness:  Jordan Contreras is a 66 y.o. female ***  Jordan Contreras  has a history of premature CAD and suffered initial myocardial infarction at age 33 and underwent stenting of her LAD in September, 1996.  In 1998 she underwent repeat intervention, secondary to restenosis and, again in May 2008 underwent cutting balloon atherotomy for in-stent restenosis.  She had a long-standing history of tobacco use, but fortunately quit smoking in May 2008.  Additional problems include hypothyroidism, hyperlipidemia, and GERD.   Over the past year, she  denies any recurrent anginal symptoms.  Her last echo Doppler study in 2012,  showed an ejection fraction greater than 55% with grade 1 diastolic dysfunction.  Her last nuclear perfusion study  in March 2012 was normal.   Over the past year.  She had been on Pravachol 20 mg in addition to Zetia 10 mg.  However, her insurance is no longer covering Zetia and due to the expense.  She is been off this for a month.  She continues to take Synthroid 50 g for hypothyroidism, Protonix 40 mg for GERD.  She continues with aspirin and Plavix for dual antiplatelets therapy and also has been taking fish oil.    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    Current Medications: Outpatient Medications Prior to Visit  Medication Sig Dispense Refill   acyclovir (ZOVIRAX) 400 MG tablet Take 400 mg by mouth.     alendronate (FOSAMAX) 70 MG tablet Take 70 mg by mouth once a week.     aspirin 81 MG tablet Take 81 mg by mouth daily.     atorvastatin (LIPITOR) 20 MG tablet TAKE 1 TABLET BY MOUTH EVERY DAY AT 6PM; PT NEEDS TO CONTACT OFFICE 15 tablet 0   Biotin 5000 MCG CAPS Take 1 capsule by mouth daily.     Calcium Carbonate-Vitamin D (CALCIUM 600+D PO) Take 1 capsule by mouth daily.     Coenzyme Q10 (CO Q-10) 200 MG CAPS Take 1 tablet by mouth 2 (two) times daily.     ezetimibe (ZETIA) 10 MG tablet Take 10 mg by mouth daily.     levothyroxine (SYNTHROID,  LEVOTHROID) 50 MCG tablet Take 50 mcg by mouth daily.     lisinopril (ZESTRIL) 5 MG tablet Take 5 mg by mouth in the morning.     Multiple Vitamin (MULTIVITAMIN) tablet Take 1 tablet by mouth daily.     OMEGA-3 FATTY ACIDS PO Take 1,400 mg by mouth daily.     pantoprazole (PROTONIX) 20 MG tablet Take 20 mg by mouth daily in the afternoon.     venlafaxine (EFFEXOR) 37.5 MG tablet Take 37.5 mg by mouth daily.     clopidogrel (PLAVIX) 75 MG tablet Take 1 tablet (75 mg total) by mouth daily. PLEASE CONTACT OFFICE FOR ADDITIONAL REFILLS FINAL WARNING (Patient not taking: Reported on 02/16/2022) 15 tablet 0   LINZESS 145 MCG CAPS capsule TAKE ONE CAPSULE EVERY DAY (Patient not taking: Reported on 02/16/2022) 30 capsule 1   pantoprazole (PROTONIX) 40 MG tablet Take 1 tablet (40 mg total) by mouth daily. PLEASE CONTACT OFFICE FOR ADDITIONAL  REFILLS (Patient not taking: Reported on 02/16/2022) 30 tablet 0   No facility-administered medications prior to visit.     Allergies:   Beta adrenergic blockers and Chantix [varenicline]   Social History   Socioeconomic History   Marital status: Married    Spouse name: Not on file   Number of children: 3   Years of education: Not on file   Highest education level: Not on file  Occupational History    Employer: [employer]  Tobacco Use   Smoking status: Former    Types: Cigarettes    Quit date: 12/28/2006    Years since quitting: 15.1   Smokeless tobacco: Never  Substance and Sexual Activity   Alcohol use: No    Alcohol/week: 0.0 standard drinks of alcohol   Drug use: No   Sexual activity: Not on file  Other Topics Concern   Not on file  Social History Narrative   Not on file   Social Determinants of Health   Financial Resource Strain: Not on file  Food Insecurity: Not on file  Transportation Needs: Not on file  Physical Activity: Not on file  Stress: Not on file  Social Connections: Not on file     Family History:  The patient's ***family history includes Breast cancer (age of onset: 66) in her daughter; Breast cancer (age of onset: 22) in her paternal aunt; Cancer in her maternal uncle; Diabetes in her sister; Heart disease in her father and mother; Lung cancer in her sister.   ROS General: Negative; No fevers, chills, or night sweats;  HEENT: Negative; No changes in vision or hearing, sinus congestion, difficulty swallowing Pulmonary: Negative; No cough, wheezing, shortness of breath, hemoptysis Cardiovascular: Negative; No chest pain, presyncope, syncope, palpitations GI: Negative; No nausea, vomiting, diarrhea, or abdominal pain GU: Negative; No dysuria, hematuria, or difficulty voiding Musculoskeletal: Negative; no myalgias, joint pain, or weakness Hematologic/Oncology: Negative; no easy bruising, bleeding Endocrine: Negative; no heat/cold intolerance; no  diabetes Neuro: Negative; no changes in balance, headaches Skin: Negative; No rashes or skin lesions Psychiatric: Negative; No behavioral problems, depression Sleep: Negative; No snoring, daytime sleepiness, hypersomnolence, bruxism, restless legs, hypnogognic hallucinations, no cataplexy Other comprehensive 14 point system review is negative.   PHYSICAL EXAM:   VS:  BP 111/72 (BP Location: Left Arm, Patient Position: Sitting, Cuff Size: Normal)   Pulse 61   Ht $R'5\' 3"'CH$  (1.6 m)   Wt 149 lb 3.2 oz (67.7 kg)   SpO2 96%   BMI 26.43 kg/m    Wt  Readings from Last 3 Encounters:  02/16/22 149 lb 3.2 oz (67.7 kg)  01/20/15 146 lb 6.4 oz (66.4 kg)  01/03/14 142 lb 4.8 oz (64.5 kg)    General: Alert, oriented, no distress.  Skin: normal turgor, no rashes, warm and dry HEENT: Normocephalic, atraumatic. Pupils equal round and reactive to light; sclera anicteric; extraocular muscles intact; Fundi ** Nose without nasal septal hypertrophy Mouth/Parynx benign; Mallinpatti scale Neck: No JVD, no carotid bruits; normal carotid upstroke Lungs: clear to ausculatation and percussion; no wheezing or rales Chest wall: without tenderness to palpitation Heart: PMI not displaced, RRR, s1 s2 normal, 1/6 systolic murmur, no diastolic murmur, no rubs, gallops, thrills, or heaves Abdomen: soft, nontender; no hepatosplenomehaly, BS+; abdominal aorta nontender and not dilated by palpation. Back: no CVA tenderness Pulses 2+ Musculoskeletal: full range of motion, normal strength, no joint deformities Extremities: no clubbing cyanosis or edema, Homan's sign negative  Neurologic: grossly nonfocal; Cranial nerves grossly wnl Psychologic: Normal mood and affect   Studies/Labs Reviewed:   February 16, 2022 ECG (independently read by me): NSR at 61; nonspecific T wave abnorrmality  Recent Labs:    Latest Ref Rng & Units 03/29/2011    8:19 PM  BMP  Glucose 70 - 99 mg/dL 97   BUN 6 - 23 mg/dL 11   Creatinine 0.50 -  1.10 mg/dL 0.68   Sodium 135 - 145 mEq/L 140   Potassium 3.5 - 5.1 mEq/L 3.5   Chloride 96 - 112 mEq/L 103   CO2 19 - 32 mEq/L 26   Calcium 8.4 - 10.5 mg/dL 9.4         Latest Ref Rng & Units 03/29/2011   10:58 PM  Hepatic Function  Total Protein 6.0 - 8.3 g/dL 7.2   Albumin 3.5 - 5.2 g/dL 3.8   AST 0 - 37 U/L 24   ALT 0 - 35 U/L 44   Alk Phosphatase 39 - 117 U/L 87   Total Bilirubin 0.3 - 1.2 mg/dL 0.6   Bilirubin, Direct 0.0 - 0.3 mg/dL 0.1        Latest Ref Rng & Units 03/29/2011    8:19 PM  CBC  WBC 4.0 - 10.5 K/uL 11.2   Hemoglobin 12.0 - 15.0 g/dL 14.6   Hematocrit 36.0 - 46.0 % 41.9   Platelets 150 - 400 K/uL 326    Lab Results  Component Value Date   MCV 86.6 03/29/2011   No results found for: "TSH" No results found for: "HGBA1C"   BNP No results found for: "BNP"  ProBNP No results found for: "PROBNP"   Lipid Panel  No results found for: "CHOL", "TRIG", "HDL", "CHOLHDL", "VLDL", "LDLCALC", "LDLDIRECT", "LABVLDL"   RADIOLOGY: No results found.   Additional studies/ records that were reviewed today include:  ***    ASSESSMENT:    No diagnosis found.   PLAN:  ***   Medication Adjustments/Labs and Tests Ordered: Current medicines are reviewed at length with the patient today.  Concerns regarding medicines are outlined above.  Medication changes, Labs and Tests ordered today are listed in the Patient Instructions below. Patient Instructions  Medication Instructions:  *** *If you need a refill on your cardiac medications before your next appointment, please call your pharmacy*   Lab Work: *** If you have labs (blood work) drawn today and your tests are completely normal, you will receive your results only by: [place] (if you have MyChart) OR A paper copy in the mail If you have  any lab test that is abnormal or we need to change your treatment, we will call you to review the  results.   Testing/Procedures: ***   Follow-Up: At [facility], you and your health needs are our priority.  As part of our continuing mission to provide you with exceptional heart care, we have created designated Provider Care Teams.  These Care Teams include your primary Cardiologist (physician) and Advanced Practice Providers (APPs -  Physician Assistants and Nurse Practitioners) who all work together to provide you with the care you need, when you need it.  We recommend signing up for the patient portal called "MyChart".  Sign up information is provided on this After Visit Summary.  MyChart is used to connect with patients for Virtual Visits (Telemedicine).  Patients are able to view lab/test results, encounter notes, upcoming appointments, etc.  Non-urgent messages can be sent to your provider as well.   To learn more about what you can do with MyChart, go to [email or web address].    Your next appointment:   {numbers 1-12:10294} {Time; day/wk/mo/yr(s):9076}  The format for your next appointment:   {F/U Visit Format          :[id number]}  Provider:   {Providers/Teams        :[id number]}{  If primary card or EP is not listed click here to update    :1}    Other Instructions ***  Important Information About Sugar         Signed, Shelva Majestic, MD  02/16/2022 3:51 PM    [facility] [address] [place], [place], [place]  [address] Phone: [phone]

## 2022-02-20 ENCOUNTER — Encounter: Payer: Self-pay | Admitting: Cardiovascular Disease

## 2022-02-23 LAB — COMPREHENSIVE METABOLIC PANEL
ALT: 27 IU/L (ref 0–32)
AST: 25 IU/L (ref 0–40)
Albumin/Globulin Ratio: 1.9 (ref 1.2–2.2)
Albumin: 4.7 g/dL (ref 3.8–4.8)
Alkaline Phosphatase: 61 IU/L (ref 44–121)
BUN/Creatinine Ratio: 14 (ref 12–28)
BUN: 15 mg/dL (ref 8–27)
Bilirubin Total: 0.9 mg/dL (ref 0.0–1.2)
CO2: 26 mmol/L (ref 20–29)
Calcium: 9.9 mg/dL (ref 8.7–10.3)
Chloride: 103 mmol/L (ref 96–106)
Creatinine, Ser: 1.08 mg/dL — ABNORMAL HIGH (ref 0.57–1.00)
Globulin, Total: 2.5 g/dL (ref 1.5–4.5)
Glucose: 96 mg/dL (ref 70–99)
Potassium: 4.8 mmol/L (ref 3.5–5.2)
Sodium: 141 mmol/L (ref 134–144)
Total Protein: 7.2 g/dL (ref 6.0–8.5)
eGFR: 56 mL/min/{1.73_m2} — ABNORMAL LOW (ref >59)

## 2022-02-23 LAB — LIPID PANEL
Chol/HDL Ratio: 2.3 ratio (ref 0.0–4.4)
Cholesterol, Total: 136 mg/dL (ref 100–199)
HDL: 59 mg/dL (ref >39)
LDL Chol Calc (NIH): 60 mg/dL (ref 0–99)
Triglycerides: 93 mg/dL (ref 0–149)
VLDL Cholesterol Cal: 17 mg/dL (ref 5–40)

## 2022-02-23 LAB — CBC
Hematocrit: 43 % (ref 34.0–46.6)
Hemoglobin: 14.1 g/dL (ref 11.1–15.9)
MCH: 29.9 pg (ref 26.6–33.0)
MCHC: 32.8 g/dL (ref 31.5–35.7)
MCV: 91 fL (ref 79–97)
Platelets: 186 10*3/uL (ref 150–450)
RBC: 4.72 x10E6/uL (ref 3.77–5.28)
RDW: 13.1 % (ref 11.7–15.4)
WBC: 5.5 10*3/uL (ref 3.4–10.8)

## 2022-02-23 LAB — LIPOPROTEIN A (LPA): Lipoprotein (a): 41.4 nmol/L (ref <75.0)

## 2022-02-23 LAB — TSH: TSH: 1.38 u[IU]/mL (ref 0.450–4.500)

## 2022-03-02 ENCOUNTER — Ambulatory Visit (HOSPITAL_COMMUNITY): Payer: Medicare Other | Attending: Cardiology

## 2022-03-02 DIAGNOSIS — I2510 Atherosclerotic heart disease of native coronary artery without angina pectoris: Secondary | ICD-10-CM | POA: Insufficient documentation

## 2022-03-02 LAB — ECHOCARDIOGRAM COMPLETE
AR max vel: 1.5 cm2
AV Area VTI: 1.44 cm2
AV Area mean vel: 1.44 cm2
AV Mean grad: 8.3 mmHg
AV Peak grad: 17.6 mmHg
Ao pk vel: 2.1 m/s
Area-P 1/2: 3.34 cm2
S' Lateral: 2.4 cm

## 2022-04-11 ENCOUNTER — Telehealth: Payer: Self-pay | Admitting: Cardiovascular Disease

## 2022-04-11 MED ORDER — ATORVASTATIN CALCIUM 20 MG PO TABS: ORAL_TABLET | ORAL | 3 refills | Status: DC

## 2022-04-11 MED ORDER — EZETIMIBE 10 MG PO TABS: 10.0000 mg | ORAL_TABLET | Freq: Every day | ORAL | 3 refills | Status: DC

## 2022-04-11 NOTE — Telephone Encounter (Signed)
*  STAT* If patient is at the pharmacy, call can be transferred to refill team.   1. Which medications need to be refilled? (please list name of each medication and dose if known) atorvastatin (LIPITOR) 20 MG tablet  2. Which pharmacy/location (including street and city if local pharmacy) is medication to be sent to? [place], [place] [address]  3. Do they need a 30 day or 90 day supply? 90 day supply

## 2022-04-11 NOTE — Telephone Encounter (Signed)
*  STAT* If patient is at the pharmacy, call can be transferred to refill team.   1. Which medications need to be refilled? (please list name of each medication and dose if known) ezetimibe (ZETIA) 10 MG tablet  2. Which pharmacy/location (including street and city if local pharmacy) is medication to be sent to? [place], [place] [address]  3. Do they need a 30 day or 90 day supply? [place]

## 2022-04-28 ENCOUNTER — Other Ambulatory Visit: Payer: Self-pay | Admitting: Internal Medicine

## 2022-04-28 DIAGNOSIS — Z0000 Encounter for general adult medical examination without abnormal findings: Secondary | ICD-10-CM

## 2022-04-29 ENCOUNTER — Other Ambulatory Visit: Payer: Self-pay | Admitting: Internal Medicine

## 2022-04-29 DIAGNOSIS — R5381 Other malaise: Secondary | ICD-10-CM

## 2022-06-14 ENCOUNTER — Other Ambulatory Visit: Payer: Self-pay | Admitting: Internal Medicine

## 2022-06-14 ENCOUNTER — Ambulatory Visit (AMBULATORY_SURGERY_CENTER): Payer: Self-pay

## 2022-06-14 VITALS — Ht 63.0 in | Wt 144.0 lb

## 2022-06-14 DIAGNOSIS — Z1211 Encounter for screening for malignant neoplasm of colon: Secondary | ICD-10-CM

## 2022-06-14 MED ORDER — NA SULFATE-K SULFATE-MG SULF 17.5-3.13-1.6 GM/177ML PO SOLN: 1.0000 | ORAL | 0 refills | Status: DC

## 2022-06-14 NOTE — Progress Notes (Signed)
No egg or soy allergy known to patient  No issues known to pt with past sedation with any surgeries or procedures Patient denies ever being told they had issues or difficulty with intubation  No FH of Malignant Hyperthermia Pt is not on diet pills Pt is not on  home 02  Pt is not on blood thinners  Pt reports occasional issues with constipation  No A fib or A flutter Have any cardiac testing pending--denied Pt instructed to use [email or web address] or GoodRx for a price reduction on prep   PV completed over phone. Suprep (2 day) instructions mailed with sample consent to verified address

## 2022-06-17 ENCOUNTER — Encounter: Payer: Self-pay | Admitting: Cardiovascular Disease

## 2022-06-17 ENCOUNTER — Ambulatory Visit: Payer: Medicare Other | Attending: Cardiovascular Disease | Admitting: Cardiovascular Disease

## 2022-06-17 VITALS — BP 120/76 | HR 61 | Ht 63.0 in | Wt 146.0 lb

## 2022-06-17 DIAGNOSIS — I2510 Atherosclerotic heart disease of native coronary artery without angina pectoris: Secondary | ICD-10-CM | POA: Diagnosis not present

## 2022-06-17 DIAGNOSIS — I5189 Other ill-defined heart diseases: Secondary | ICD-10-CM | POA: Insufficient documentation

## 2022-06-17 DIAGNOSIS — I252 Old myocardial infarction: Secondary | ICD-10-CM | POA: Diagnosis not present

## 2022-06-17 DIAGNOSIS — Z9861 Coronary angioplasty status: Secondary | ICD-10-CM | POA: Insufficient documentation

## 2022-06-17 DIAGNOSIS — E039 Hypothyroidism, unspecified: Secondary | ICD-10-CM | POA: Diagnosis present

## 2022-06-17 DIAGNOSIS — E785 Hyperlipidemia, unspecified: Secondary | ICD-10-CM | POA: Insufficient documentation

## 2022-06-17 NOTE — Patient Instructions (Signed)
Medication Instructions:  The current medical regimen is effective;  continue present plan and medications as directed. Please refer to the Current Medication list given to you today.  *If you need a refill on your cardiac medications before your next appointment, please call your pharmacy*  Lab Work: NONE  Testing/Procedures: NONE  Follow-Up: At [facility], you and your health needs are our priority.  As part of our continuing mission to provide you with exceptional heart care, we have created designated Provider Care Teams.  These Care Teams include your primary Cardiologist (physician) and Advanced Practice Providers (APPs -  Physician Assistants and Nurse Practitioners) who all work together to provide you with the care you need, when you need it.  Your next appointment:   12 month(s)  The format for your next appointment:   In Person  Provider:   Shelva Majestic, MD     Important Information About Sugar

## 2022-06-17 NOTE — Progress Notes (Signed)
Cardiology Office Note    Date:  06/21/2022   ID:  Jordan Contreras, Jordan Contreras 09-Apr-1956, MRN [id number]  PCP:  Sueanne Margarita, DO  Cardiologist:  Shelva Majestic, MD    76-monthfollow-up office visit after she reestablish cardiology care with me in June 2023.  History of Present Illness:  Jordan NATIONis a 66y.o. female who has a history of premature CAD. She suffered initial myocardial infarction at age [age]and underwent stenting of her LAD in SOct 26, 1996  In 102-26-1998she underwent repeat intervention, secondary to restenosis and, again in May 2008 underwent cutting balloon atherotomy for in-stent restenosis.  She had a long-standing history of tobacco use, but fortunately quit smoking in May 2008.  Additional problems include hypothyroidism, hyperlipidemia, and GERD.   An echo Doppler study in 22012-02-26  showed an ejection fraction greater than 55% with grade 1 diastolic dysfunction. A nuclear perfusion study  in March 2012 was normal.   I last saw her on Jan 20, 2015 which time she remained stable and was without chest pain or shortness of breath.  He had been on Pravachol in addition to Zetia hyperlipidemia but since her insurance was no longer covering Zetia due to expense he had been off that for a month.  History of hypothyroidism she was on Synthroid 50 mcg, and was on Protonix 40 mg for GERD.  She continued being on DAPT with aspirin/Plavix and was taking fish oil.  [facility] I[facility]ilani Able Since I last saw her in 22016-02-26 [facility].  She last saw Dr. HTylene Fantasiain SOct 26, 2022  Her daughter had passed away in J02-26-22in her 483sfrom breast cancer.  I saw her on February 16, 2022 after she [place].  At the time she was on atorvastatin 20 mg and Zetia 10 mg for hyperlipidemia and lisinopril 5 mg for  hypertension.  She was on pantoprazole 20 mg for GERD and levothyroxine 50 mcg for hypothyroidism.  She takes alendronate 70 mg weekly for osteo porosis.  She has a prescription for Effexor which she takes 1/2 tablet as needed since her oldest daughter died of metastatic breast CA at 456  She denied any recent chest pain or shortness of breath.  She has not had recent laboratory.    I recommended that she undergo a follow-up echo Doppler study which was done on March 02, 2022.  This revealed normal LV function with EF 60 to 65% without wall motion abnormality.  There was grade 1 diastolic dysfunction and evidence for mild aortic sclerosis without stenosis.  Repeat laboratory on February 22, 2022 showed total cholesterol 136, triglycerides 93, LDL 60.  Renal function was normal with creatinine 1.08.  Presently, she continues to feel well and denies chest pain or shortness of breath.  She remains active and walks on a daily basis.  She continues to be on atorvastatin 20 mg and omega-3 fatty acid 1400 mg daily in addition to Zetia for hyperlipidemia.  She is on levothyroxine 50 mcg for hypothyroidism and continues to be on lisinopril at low-dose 2.5 mg for hypertension.  She has GERD for which she takes pantoprazole.  She also is on Fosamax weekly injection for her bones.  She presents for reevaluation.  Past Medical History:  Diagnosis Date  Allergic rhinitis    CAD (coronary artery disease)    premature 04-1995   Family history of breast cancer    Family history of lung cancer    Hepatitis    1982 caused by water or fish   Hyperlipidemia    IBS (irritable bowel syndrome)    Lyme disease    Thyroid disease     Past Surgical History:  Procedure Laterality Date   CORONARY ANGIOPLASTY WITH [place]   stents to the LAD   CORONARY ANGIOPLASTY WITH STENT PLACEMENT  12/24/1996   PTCA to LAD & diagonal to restenosis at stent site   CORONARY ANGIOPLASTY WITH STENT PLACEMENT  01/26/2007    in-stent restenosis LAD   NM MYOCAR PERF WALL MOTION  10/29/2010   normal   TMJ ARTHROPLASTY  1980's   TUBAL LIGATION  12/88   US ECHOCARDIOGRAPHY  10/29/2010   Stage I diastolic function,trace MR & TR.    Current Medications: Outpatient Medications Prior to Visit  Medication Sig Dispense Refill   acyclovir (ZOVIRAX) 400 MG tablet Take 400 mg by mouth.     alendronate (FOSAMAX) 70 MG tablet Take 70 mg by mouth once a week.     aspirin 81 MG tablet Take 81 mg by mouth daily.     atorvastatin (LIPITOR) 20 MG tablet TAKE 1 TABLET BY MOUTH EVERY DAY AT 6PM 90 tablet 3   Biotin 5000 MCG CAPS Take 1 capsule by mouth daily.     Calcium Carbonate-Vitamin D (CALCIUM 600+D PO) Take 1 capsule by mouth daily.     Coenzyme Q10 (CO Q-10) 200 MG CAPS Take 1 tablet by mouth 2 (two) times daily.     ezetimibe (ZETIA) 10 MG tablet Take 1 tablet (10 mg total) by mouth daily. 90 tablet 3   levothyroxine (SYNTHROID, LEVOTHROID) 50 MCG tablet Take 50 mcg by mouth daily.     lisinopril (ZESTRIL) 2.5 MG tablet Take 1 tablet (2.5 mg total) by mouth in the morning. 90 tablet 3   Multiple Vitamin (MULTIVITAMIN) tablet Take 1 tablet by mouth daily.     Na Sulfate-K Sulfate-Mg Sulf 17.5-3.13-1.6 GM/177ML SOLN Take 1 kit by mouth as directed. May use generic Suprep, no prior authorization. Take as directed. 354 mL 0   OMEGA-3 FATTY ACIDS PO Take 1,400 mg by mouth daily.     pantoprazole (PROTONIX) 20 MG tablet Take 20 mg by mouth daily in the afternoon.     No facility-administered medications prior to visit.     Allergies:   Beta adrenergic blockers, Chantix [varenicline], Morphine, and Latex   Social History   Socioeconomic History   Marital status: Married    Spouse name: Not on file   Number of children: 3   Years of education: Not on file   Highest education level: Not on file  Occupational History    Employer: [employer]  Tobacco Use   Smoking status: Former    Types: Cigarettes    Quit date:  12/28/2006    Years since quitting: 15.4   Smokeless tobacco: Never  Substance and Sexual Activity   Alcohol use: No    Alcohol/week: 0.0 standard drinks of alcohol   Drug use: No   Sexual activity: Not on file  Other Topics Concern   Not on file  Social History Narrative   Not on file   Social Determinants of Health   Financial Resource Strain: Not on file  Food Insecurity: Not on file  Transportation Needs: Not on file  Physical Activity: Not on file  Stress: Not on file  Social Connections: Not on file     Socially she is married for the last 5 years.  She has 3 children and 3 grandchildren.  She currently is living with her husband and mother-in-law in [place].  Previously she was in the Audiological [employer].  There is remote tobacco history but she quit on Jan 25, 2007.  There is no alcohol use.  She walks occasionally.  Family History:  The patient's family history includes Breast cancer (age of onset: 68) in her daughter; Breast cancer (age of onset: 5) in her paternal aunt; Cancer in her maternal uncle; Diabetes in her sister; Heart disease in her father and mother; Lung cancer in her sister.  Parents are deceased.  Father had a brain aneurysm and died suddenly at age 23.  Mother died with emphysema at age 71.  She has 3 sisters 1 died at age 53 with lung cancer.  She has 3 children and her daughter at age 74 died with metastatic breast cancer.  Her living children are 20 and 39.  ROS General: Negative; No fevers, chills, or night sweats;  HEENT: Negative; No changes in vision or hearing, sinus congestion, difficulty swallowing Pulmonary: Negative; No cough, wheezing, shortness of breath, hemoptysis Cardiovascular: See HPI GI: Negative; No nausea, vomiting, diarrhea, or abdominal pain GU: Negative; No dysuria, hematuria, or difficulty voiding Musculoskeletal: Negative; no myalgias, joint pain, or weakness Hematologic/Oncology: Negative; no easy bruising, bleeding Endocrine:  Negative; no heat/cold intolerance; no diabetes Neuro: Negative; no changes in balance, headaches Skin: Negative; No rashes or skin lesions Psychiatric: Negative; No behavioral problems, depression Sleep: Negative; No snoring, daytime sleepiness, hypersomnolence, bruxism, restless legs, hypnogognic hallucinations, no cataplexy Other comprehensive 14 point system review is negative.   PHYSICAL EXAM:   VS:  BP 120/76 (BP Location: Left Arm, Patient Position: Sitting, Cuff Size: Normal)   Pulse 61   Ht 5' 3" (1.6 m)   Wt 146 lb (66.2 kg)   BMI 25.86 kg/m     Repeat blood pressure by me was 112/72  Wt Readings from Last 3 Encounters:  06/17/22 146 lb (66.2 kg)  06/14/22 144 lb (65.3 kg)  02/16/22 149 lb 3.2 oz (67.7 kg)    General: Alert, oriented, no distress.  Skin: normal turgor, no rashes, warm and dry HEENT: Normocephalic, atraumatic. Pupils equal round and reactive to light; sclera anicteric; extraocular muscles intact;  Nose without nasal septal hypertrophy Mouth/Parynx benign; Mallinpatti scale 3 Neck: No JVD, no carotid bruits; normal carotid upstroke Lungs: clear to ausculatation and percussion; no wheezing or rales Chest wall: without tenderness to palpitation Heart: PMI not displaced, RRR, s1 s2 normal, 1/6 systolic murmur, no diastolic murmur, no rubs, gallops, thrills, or heaves Abdomen: soft, nontender; no hepatosplenomehaly, BS+; abdominal aorta nontender and not dilated by palpation. Back: no CVA tenderness Pulses 2+ Musculoskeletal: full range of motion, normal strength, no joint deformities Extremities: no clubbing cyanosis or edema, Homan's sign negative  Neurologic: grossly nonfocal; Cranial nerves grossly wnl Psychologic: Normal mood and affect    Studies/Labs Reviewed:    June 17, 2022 ECG (independently read by me): NSR at 61, nonspecific T wave abnormality  February 16, 2022 ECG (independently read by me): NSR at 61; nonspecific T wave  abnorrmality  Recent Labs:    Latest Ref Rng & Units 02/22/2022   10:52 AM 03/29/2011    8:19 PM  BMP  Glucose 70 -  99 mg/dL 96  97   BUN 8 - 27 mg/dL 15  11   Creatinine 0.57 - 1.00 mg/dL 1.08  0.68   BUN/Creat Ratio 12 - 28 14    Sodium 134 - 144 mmol/L 141  140   Potassium 3.5 - 5.2 mmol/L 4.8  3.5   Chloride 96 - 106 mmol/L 103  103   CO2 20 - 29 mmol/L 26  26   Calcium 8.7 - 10.3 mg/dL 9.9  9.4         Latest Ref Rng & Units 02/22/2022   10:52 AM 03/29/2011   10:58 PM  Hepatic Function  Total Protein 6.0 - 8.5 g/dL 7.2  7.2   Albumin 3.8 - 4.8 g/dL 4.7  3.8   AST 0 - 40 IU/L 25  24   ALT 0 - 32 IU/L 27  44   Alk Phosphatase 44 - 121 IU/L 61  87   Total Bilirubin 0.0 - 1.2 mg/dL 0.9  0.6   Bilirubin, Direct 0.0 - 0.3 mg/dL  0.1        Latest Ref Rng & Units 02/22/2022   10:52 AM 03/29/2011    8:19 PM  CBC  WBC 3.4 - 10.8 x10E3/uL 5.5  11.2   Hemoglobin 11.1 - 15.9 g/dL 14.1  14.6   Hematocrit 34.0 - 46.6 % 43.0  41.9   Platelets 150 - 450 x10E3/uL 186  326    Lab Results  Component Value Date   MCV 91 02/22/2022   MCV 86.6 03/29/2011   Lab Results  Component Value Date   TSH 1.380 02/22/2022   No results found for: "HGBA1C"   BNP No results found for: "BNP"  ProBNP No results found for: "PROBNP"   Lipid Panel     Component Value Date/Time   CHOL 136 02/22/2022 1052   TRIG 93 02/22/2022 1052   HDL 59 02/22/2022 1052   CHOLHDL 2.3 02/22/2022 1052   LDLCALC 60 02/22/2022 1052   LABVLDL 17 02/22/2022 1052     RADIOLOGY: No results found.   Additional studies/ records that were reviewed today include:   Reviewed prior records from 2016 and records of [facility] clinic were reviewed.   ASSESSMENT:    1. Coronary artery disease involving native coronary artery of native heart without angina pectoris   2. Old MI (myocardial infarction):PCI LAD 1996   3. History of percutaneous coronary intervention: 1996, 1998, 2008   4. Hyperlipidemia with  target LDL less than 70   5. Hypothyroidism, unspecified type   6. Grade I diastolic dysfunction     PLAN:  Ms. Merelin Human is a very pleasant 66 year old female who has a history of CAD and had suffered an initial myocardial infarction at age 68 at which time she underwent stenting of her LAD in September 1996.  She required repeat intervention in 1999 secondary to restenosis and again in May 2008 when she underwent underwent [place] atherotomy for in-stent restenosis.  She had a prior longstanding tobacco history but fortunately quit in 2008.  After not having seen her since 2016, she reestablish care with me in May 2023.  At that time I recommended she undergo follow-up echo cardiographic evaluation which was done on March 02, 2022 and showed normal LV systolic function with EF 60 to 65%, normal wall motion and grade 1 diastolic dysfunction.  There was mild aortic sclerosis without stenosis.  She underwent subsequent laboratory on February 22, 2022.  Lipid studies were excellent  with total cholesterol 136, triglycerides 93, HDL 59, LDL 60. I also checked LP(a) which was normal at 41.4.  Presently, her blood pressure is excellent on her low-dose lisinopril.  She continues to be on atorvastatin Zetia and omega-3 fatty acids for lipids.  She is on levothyroxine 50 mcg for hypothyroidism with most recent TSH excellent at 1.38.  Clinically she is doing well and is asymptomatic.  I will see her in 1 year for reevaluation or sooner as needed.   Medication Adjustments/Labs and Tests Ordered: Current medicines are reviewed at length with the patient today.  Concerns regarding medicines are outlined above.  Medication changes, Labs and Tests ordered today are listed in the Patient Instructions below. Patient Instructions  Medication Instructions:  The current medical regimen is effective;  continue present plan and medications as directed. Please refer to the Current Medication list given to you today.   *If you need a refill on your cardiac medications before your next appointment, please call your pharmacy*  Lab Work: NONE  Testing/Procedures: NONE  Follow-Up: At [facility], you and your health needs are our priority.  As part of our continuing mission to provide you with exceptional heart care, we have created designated Provider Care Teams.  These Care Teams include your primary Cardiologist (physician) and Advanced Practice Providers (APPs -  Physician Assistants and Nurse Practitioners) who all work together to provide you with the care you need, when you need it.  Your next appointment:   12 month(s)  The format for your next appointment:   In Person  Provider:   Shelva Majestic, MD     Important Information About Sugar         Signed, Shelva Majestic, MD  06/21/2022 5:58 PM    [facility] [address], [place], [place], [place]  [address] Phone: [phone]

## 2022-06-21 ENCOUNTER — Encounter: Payer: Self-pay | Admitting: Cardiovascular Disease

## 2022-06-28 ENCOUNTER — Encounter: Payer: Self-pay | Admitting: Internal Medicine

## 2022-06-28 ENCOUNTER — Ambulatory Visit (AMBULATORY_SURGERY_CENTER): Payer: Medicare Other | Admitting: Internal Medicine

## 2022-06-28 VITALS — BP 121/64 | HR 55 | Temp 96.2°F | Resp 17 | Ht 63.0 in | Wt 144.0 lb

## 2022-06-28 DIAGNOSIS — Z1211 Encounter for screening for malignant neoplasm of colon: Secondary | ICD-10-CM

## 2022-06-28 DIAGNOSIS — D124 Benign neoplasm of descending colon: Secondary | ICD-10-CM | POA: Diagnosis not present

## 2022-06-28 DIAGNOSIS — D123 Benign neoplasm of transverse colon: Secondary | ICD-10-CM | POA: Diagnosis not present

## 2022-06-28 MED ORDER — SODIUM CHLORIDE 0.9 % IV SOLN: 500.0000 mL | Freq: Once | INTRAVENOUS | Status: DC

## 2022-06-28 NOTE — Progress Notes (Unsigned)
GASTROENTEROLOGY PROCEDURE H&P NOTE   Primary Care Physician: Sueanne Margarita, DO    Reason for Procedure:   screening  Plan:    Screening colonoscopy  Patient is appropriate for endoscopic procedure(s) in the ambulatory ([place]) setting.  The nature of the procedure, as well as the risks, benefits, and alternatives were carefully and thoroughly reviewed with the patient. Ample time for discussion and questions allowed. The patient understood, was satisfied, and agreed to proceed.     HPI: Jordan Contreras is a 66 y.o. female who presents for screening colonoscopy.  Medical history as below.  Tolerated the prep.  No recent chest pain or shortness of breath.  No abdominal pain today.  Past Medical History:  Diagnosis Date   Allergic rhinitis    CAD (coronary artery disease)    premature 04-1995   Family history of breast cancer    Family history of lung cancer    Hepatitis    1982 caused by water or fish   Hyperlipidemia    IBS (irritable bowel syndrome)    Lyme disease    Thyroid disease     Past Surgical History:  Procedure Laterality Date   CORONARY ANGIOPLASTY WITH [place]   stents to the LAD   CORONARY ANGIOPLASTY WITH STENT PLACEMENT  12/24/1996   PTCA to LAD & diagonal to restenosis at stent site   CORONARY ANGIOPLASTY WITH STENT PLACEMENT  01/26/2007   in-stent restenosis LAD   NM MYOCAR PERF WALL MOTION  10/29/2010   normal   TMJ ARTHROPLASTY  1980's   TUBAL LIGATION  12/88   US ECHOCARDIOGRAPHY  10/29/2010   Stage I diastolic function,trace MR & TR.    Prior to Admission medications   Medication Sig Start Date End Date Taking? Authorizing Provider  alendronate (FOSAMAX) 70 MG tablet Take 70 mg by mouth once a week. 11/30/21  Yes [provider]  aspirin 81 MG tablet Take 81 mg by mouth daily.   Yes [provider]  atorvastatin (LIPITOR) 20 MG tablet TAKE 1 TABLET BY MOUTH EVERY DAY AT 6PM 04/11/22  Yes Troy Sine, MD  Biotin  5000 MCG CAPS Take 1 capsule by mouth daily.   Yes [provider]  Calcium Carbonate-Vitamin D (CALCIUM 600+D PO) Take 1 capsule by mouth daily.   Yes [provider]  Coenzyme Q10 (CO Q-10) 200 MG CAPS Take 1 tablet by mouth 2 (two) times daily.   Yes [provider]  ezetimibe (ZETIA) 10 MG tablet Take 1 tablet (10 mg total) by mouth daily. 04/11/22  Yes Troy Sine, MD  levothyroxine (SYNTHROID, LEVOTHROID) 50 MCG tablet Take 50 mcg by mouth daily.   Yes [provider]  lisinopril (ZESTRIL) 2.5 MG tablet Take 1 tablet (2.5 mg total) by mouth in the morning. 02/16/22  Yes Troy Sine, MD  Multiple Vitamin (MULTIVITAMIN) tablet Take 1 tablet by mouth daily.   Yes [provider]  OMEGA-3 FATTY ACIDS PO Take 1,400 mg by mouth daily.   Yes [provider]  pantoprazole (PROTONIX) 20 MG tablet Take 20 mg by mouth daily in the afternoon. 02/15/22  Yes [provider]  acyclovir (ZOVIRAX) 400 MG tablet Take 400 mg by mouth. Patient not taking: Reported on 06/28/2022 09/09/21   [provider]    Current Outpatient Medications  Medication Sig Dispense Refill   alendronate (FOSAMAX) 70 MG tablet Take 70 mg by mouth once a week.  aspirin 81 MG tablet Take 81 mg by mouth daily.     atorvastatin (LIPITOR) 20 MG tablet TAKE 1 TABLET BY MOUTH EVERY DAY AT 6PM 90 tablet 3   Biotin 5000 MCG CAPS Take 1 capsule by mouth daily.     Calcium Carbonate-Vitamin D (CALCIUM 600+D PO) Take 1 capsule by mouth daily.     Coenzyme Q10 (CO Q-10) 200 MG CAPS Take 1 tablet by mouth 2 (two) times daily.     ezetimibe (ZETIA) 10 MG tablet Take 1 tablet (10 mg total) by mouth daily. 90 tablet 3   levothyroxine (SYNTHROID, LEVOTHROID) 50 MCG tablet Take 50 mcg by mouth daily.     lisinopril (ZESTRIL) 2.5 MG tablet Take 1 tablet (2.5 mg total) by mouth in the morning. 90 tablet 3   Multiple Vitamin (MULTIVITAMIN) tablet Take 1 tablet by mouth  daily.     OMEGA-3 FATTY ACIDS PO Take 1,400 mg by mouth daily.     pantoprazole (PROTONIX) 20 MG tablet Take 20 mg by mouth daily in the afternoon.     acyclovir (ZOVIRAX) 400 MG tablet Take 400 mg by mouth. (Patient not taking: Reported on 06/28/2022)     Current Facility-Administered Medications  Medication Dose Route Frequency Provider Last Rate Last Admin   0.9 %  sodium chloride infusion  500 mL Intravenous Once Kwaku Mostafa, Lajuan Lines, MD        Allergies as of 06/28/2022 - Review Complete 06/28/2022  Allergen Reaction Noted   Beta adrenergic blockers  12/31/2013   Chantix [varenicline]  12/31/2013   Morphine Other (See Comments) 06/14/2022   Latex Rash 06/14/2022    Family History  Problem Relation Age of Onset   Heart disease Mother    Heart disease Father    Lung cancer Sister    Diabetes Sister    Cancer Maternal Uncle        jaw   Breast cancer Paternal Aunt 82   Breast cancer Daughter 5   Colon cancer Neg Hx    Esophageal cancer Neg Hx    Stomach cancer Neg Hx    Rectal cancer Neg Hx     Social History   Socioeconomic History   Marital status: Married    Spouse name: Not on file   Number of children: 3   Years of education: Not on file   Highest education level: Not on file  Occupational History    Employer: [employer]  Tobacco Use   Smoking status: Former    Types: Cigarettes    Quit date: 12/28/2006    Years since quitting: 15.5   Smokeless tobacco: Never  Vaping Use   Vaping Use: Never used  Substance and Sexual Activity   Alcohol use: No    Alcohol/week: 0.0 standard drinks of alcohol   Drug use: No   Sexual activity: Not on file  Other Topics Concern   Not on file  Social History Narrative   Not on file   Social Determinants of Health   Financial Resource Strain: Not on file  Food Insecurity: Not on file  Transportation Needs: Not on file  Physical Activity: Not on file  Stress: Not on file  Social Connections: Not on file  Intimate  Partner Violence: Not on file    Physical Exam: Vital signs in last 24 hours: '@BP'$  131/80   Pulse (!) 58   Temp (!) 96.2 F (35.7 C)   Resp 13   Ht '5\' 3"'$  (1.6 m)  Wt 144 lb (65.3 kg)   SpO2 97%   BMI 25.51 kg/m  GEN: NAD EYE: Sclerae anicteric ENT: MMM CV: Non-tachycardic Pulm: CTA b/l GI: Soft, NT/ND NEURO:  Alert & Oriented x 3   Zenovia Jarred, MD [facility]  06/28/2022 1:58 PM

## 2022-06-28 NOTE — Patient Instructions (Signed)
Please read handouts provided. Continue present medications. Await pathology results.   YOU HAD AN ENDOSCOPIC PROCEDURE TODAY AT THE [facility]:   Refer to the procedure report that was given to you for any specific questions about what was found during the examination.  If the procedure report does not answer your questions, please call your gastroenterologist to clarify.  If you requested that your care partner not be given the details of your procedure findings, then the procedure report has been included in a sealed envelope for you to review at your convenience later.  YOU SHOULD EXPECT: Some feelings of bloating in the abdomen. Passage of more gas than usual.  Walking can help get rid of the air that was put into your GI tract during the procedure and reduce the bloating. If you had a lower endoscopy (such as a colonoscopy or flexible sigmoidoscopy) you may notice spotting of blood in your stool or on the toilet paper. If you underwent a bowel prep for your procedure, you may not have a normal bowel movement for a few days.  Please Note:  You might notice some irritation and congestion in your nose or some drainage.  This is from the oxygen used during your procedure.  There is no need for concern and it should clear up in a day or so.  SYMPTOMS TO REPORT IMMEDIATELY:  Following lower endoscopy (colonoscopy or flexible sigmoidoscopy):  Excessive amounts of blood in the stool  Significant tenderness or worsening of abdominal pains  Swelling of the abdomen that is new, acute  Fever of 100F or higher  For urgent or emergent issues, a gastroenterologist can be reached at any hour by calling [phone]. Do not use MyChart messaging for urgent concerns.    DIET:  We do recommend a small meal at first, but then you may proceed to your regular diet.  Drink plenty of fluids but you should avoid alcoholic beverages for 24 hours.  ACTIVITY:  You should plan to take it easy for  the rest of today and you should NOT DRIVE or use heavy machinery until tomorrow (because of the sedation medicines used during the test).    FOLLOW UP: Our staff will call the number listed on your records the next business day following your procedure.  We will call around 7:15- 8:00 am to check on you and address any questions or concerns that you may have regarding the information given to you following your procedure. If we do not reach you, we will leave a message.     If any biopsies were taken you will be contacted by phone or by letter within the next 1-3 weeks.  Please call us at [phone] if you have not heard about the biopsies in 3 weeks.    SIGNATURES/CONFIDENTIALITY: You and/or your care partner have signed paperwork which will be entered into your electronic medical record.  These signatures attest to the fact that that the information above on your After Visit Summary has been reviewed and is understood.  Full responsibility of the confidentiality of this discharge information lies with you and/or your care-partner. 

## 2022-06-28 NOTE — Op Note (Signed)
[facility] Patient Name: Jordan Contreras Procedure Date: 06/28/2022 1:45 PM MRN: [id number] Endoscopist: Jerene Bears , MD, [id number] Age: 66 Referring MD:  Date of Birth: October 18, 1955 Gender: Female Account #: [id number] Procedure:                Colonoscopy Indications:              Screening for colorectal malignant neoplasm, Last                            colonoscopy 10 years ago Medicines:                Monitored Anesthesia Care Procedure:                Pre-Anesthesia Assessment:                           - Prior to the procedure, a History and Physical                            was performed, and patient medications and                            allergies were reviewed. The patient's tolerance of                            previous anesthesia was also reviewed. The risks                            and benefits of the procedure and the sedation                            options and risks were discussed with the patient.                            All questions were answered, and informed consent                            was obtained. Prior Anticoagulants: The patient has                            taken no anticoagulant or antiplatelet agents. ASA                            Grade Assessment: II - A patient with mild systemic                            disease. After reviewing the risks and benefits,                            the patient was deemed in satisfactory condition to                            undergo the procedure.  After obtaining informed consent, the colonoscope                            was passed under direct vision. Throughout the                            procedure, the patient's blood pressure, pulse, and                            oxygen saturations were monitored continuously. The                            Olympus PCF-H190DL [id number]) Colonoscope was                            introduced through the anus and  advanced to the                            cecum, identified by appendiceal orifice and                            ileocecal valve. The colonoscopy was performed                            without difficulty. The patient tolerated the                            procedure well. The quality of the bowel                            preparation was good. The ileocecal valve,                            appendiceal orifice, and rectum were photographed. Scope In: 2:01:07 PM Scope Out: 2:19:59 PM Scope Withdrawal Time: 0 hours 14 minutes 34 seconds  Total Procedure Duration: 0 hours 18 minutes 52 seconds  Findings:                 The digital rectal exam was normal.                           Two sessile polyps were found in the transverse                            colon. The polyps were 3 to 7 mm in size. These                            polyps were removed with a cold snare. Resection                            and retrieval were complete.                           A 5 mm polyp was found in the descending colon. The  polyp was sessile. The polyp was removed with a                            cold snare. Resection and retrieval were complete.                           Multiple large-mouthed and small-mouthed                            diverticula were found in the sigmoid colon.                            Peri-diverticular erythema was seen.                           The retroflexed view of the distal rectum and anal                            verge was normal and showed no anal or rectal                            abnormalities. Complications:            No immediate complications. Estimated Blood Loss:     Estimated blood loss was minimal. Impression:               - Two 3 to 7 mm polyps in the transverse colon,                            removed with a cold snare. Resected and retrieved.                           - One 5 mm polyp in the descending colon, removed                             with a cold snare. Resected and retrieved.                           - Severe diverticulosis in the sigmoid colon.                            Peri-diverticular erythema was seen.                           - The distal rectum and anal verge are normal on                            retroflexion view. Recommendation:           - Patient has a contact number available for                            emergencies. The signs and symptoms of potential  delayed complications were discussed with the                            patient. Return to normal activities tomorrow.                            Written discharge instructions were provided to the                            patient.                           - Resume previous diet.                           - Continue present medications.                           - Await pathology results.                           - Repeat colonoscopy is recommended. The                            colonoscopy date will be determined after pathology                            results from today's exam become available for                            review. Jerene Bears, MD 06/28/2022 2:42:45 PM This report has been signed electronically.

## 2022-06-28 NOTE — Progress Notes (Unsigned)
Report to PACU, RN, vss, BBS= Clear.  

## 2022-06-28 NOTE — Progress Notes (Unsigned)
Called to room to assist during endoscopic procedure.  Patient ID and intended procedure confirmed with present staff. Received instructions for my participation in the procedure from the performing physician.  

## 2022-06-28 NOTE — Progress Notes (Signed)
Pt's states no medical or surgical changes since previsit or office visit. 

## 2022-06-29 ENCOUNTER — Telehealth: Payer: Self-pay | Admitting: *Deleted

## 2022-06-29 NOTE — Telephone Encounter (Signed)
  Follow up Call-     06/28/2022    1:06 PM  Call back number  Post procedure Call Back phone  # [phone]  Permission to leave phone message Yes     Patient questions:  Do you have a fever, pain , or abdominal swelling? No. Pain Score  0 *  Have you tolerated food without any problems? Yes.    Have you been able to return to your normal activities? Yes.    Do you have any questions about your discharge instructions: Diet   No. Medications  No. Follow up visit  No.  Do you have questions or concerns about your Care? No.  Actions: * If pain score is 4 or above: No action needed, pain <4.

## 2022-07-04 ENCOUNTER — Encounter: Payer: Self-pay | Admitting: Internal Medicine

## 2022-07-05 ENCOUNTER — Other Ambulatory Visit: Payer: Self-pay | Admitting: Family Medicine

## 2022-07-05 DIAGNOSIS — Z1231 Encounter for screening mammogram for malignant neoplasm of breast: Secondary | ICD-10-CM

## 2022-08-17 ENCOUNTER — Ambulatory Visit
Admission: RE | Admit: 2022-08-17 | Discharge: 2022-08-17 | Disposition: A | Discharge status: Home / Self Care | Payer: Medicare Other | Source: Ambulatory Visit | Attending: Family Medicine | Admitting: Family Medicine

## 2022-08-17 DIAGNOSIS — Z1231 Encounter for screening mammogram for malignant neoplasm of breast: Secondary | ICD-10-CM

## 2022-09-02 ENCOUNTER — Ambulatory Visit: Payer: Medicare Other

## 2023-01-29 ENCOUNTER — Other Ambulatory Visit: Payer: Self-pay | Admitting: Cardiovascular Disease

## 2023-03-24 ENCOUNTER — Other Ambulatory Visit: Payer: Self-pay | Admitting: Cardiovascular Disease

## 2023-06-19 ENCOUNTER — Other Ambulatory Visit: Payer: Self-pay | Admitting: Cardiovascular Disease

## 2023-06-20 ENCOUNTER — Other Ambulatory Visit: Payer: Self-pay | Admitting: Cardiovascular Disease

## 2023-06-27 ENCOUNTER — Other Ambulatory Visit: Payer: Self-pay | Admitting: Internal Medicine

## 2023-06-27 DIAGNOSIS — M810 Age-related osteoporosis without current pathological fracture: Secondary | ICD-10-CM

## 2023-07-05 ENCOUNTER — Other Ambulatory Visit: Payer: Self-pay | Admitting: Internal Medicine

## 2023-07-05 DIAGNOSIS — Z1231 Encounter for screening mammogram for malignant neoplasm of breast: Secondary | ICD-10-CM

## 2023-07-06 ENCOUNTER — Ambulatory Visit: Payer: Medicare Other | Attending: Physician Assistant | Admitting: Physician Assistant

## 2023-07-06 ENCOUNTER — Encounter: Payer: Self-pay | Admitting: Physician Assistant

## 2023-07-06 VITALS — BP 126/82 | HR 59 | Ht 63.0 in | Wt 148.0 lb

## 2023-07-06 DIAGNOSIS — Z01818 Encounter for other preprocedural examination: Secondary | ICD-10-CM | POA: Insufficient documentation

## 2023-07-06 DIAGNOSIS — E039 Hypothyroidism, unspecified: Secondary | ICD-10-CM | POA: Diagnosis present

## 2023-07-06 DIAGNOSIS — R0609 Other forms of dyspnea: Secondary | ICD-10-CM | POA: Diagnosis not present

## 2023-07-06 DIAGNOSIS — Z79899 Other long term (current) drug therapy: Secondary | ICD-10-CM | POA: Insufficient documentation

## 2023-07-06 DIAGNOSIS — I2510 Atherosclerotic heart disease of native coronary artery without angina pectoris: Secondary | ICD-10-CM | POA: Diagnosis not present

## 2023-07-06 DIAGNOSIS — E785 Hyperlipidemia, unspecified: Secondary | ICD-10-CM | POA: Diagnosis not present

## 2023-07-06 MED ORDER — ATORVASTATIN CALCIUM 20 MG PO TABS: ORAL_TABLET | ORAL | 3 refills | Status: DC

## 2023-07-06 MED ORDER — EZETIMIBE 10 MG PO TABS: 10.0000 mg | ORAL_TABLET | Freq: Every day | ORAL | 3 refills | Status: DC

## 2023-07-06 MED ORDER — LISINOPRIL 2.5 MG PO TABS: 2.5000 mg | ORAL_TABLET | Freq: Every morning | ORAL | 3 refills | Status: DC

## 2023-07-06 NOTE — Progress Notes (Signed)
Cardiology Office Note:  .   Date:  07/06/2023  ID:  [name], DOB 09-Feb-1956, MRN [id number] PCP: Charlane Ferretti, DO  [facility] Providers Cardiologist:  Nicki Guadalajara, MD     History of Present Illness: .   Jordan Contreras is a 67 y.o. female with PMH of premature CAD, former tobacco use quit in 12/2006, hypothyroidism, hyperlipidemia, osteoporosis and GERD.  Patient had early MI at age 65 and underwent stenting of her LAD in September 1996.  She underwent repeat intervention in 1998 secondary to restenosis and again in May 2008 with [place] angioplasty for in-stent restenosis.  Echocardiogram in 2012 showed EF greater than 55%, grade 1 DD.  Myoview in March 2012 was normal.  Patient remarried and moved to the [facility] and was followed by Dr. Glynis Smiles.  Her daughter passed away in 02-09-22in her 70s from [place] cancer.  Around 2023, she moved back to [facility] to reestablish with our cardiology service.  Repeat echocardiogram in February 27, 2022 showed EF 60 to 65% with no wall motion abnormality, grade 1 DD, no significant valve issue.  Patient was last seen by Dr. Tresa Endo in October 2023 at which time she was doing well.  Patient presents today for follow-up.  She denies any chest pain, however in the past month, she has been having increasing dyspnea on exertion.  She has no lower extremity edema, orthopnea or PND.  On physical exam, she does not have any significant heart murmur.  I will obtain preliminary blood work to rule out secondary causes.  If blood work is normal, I would recommend proceed with PET stress test to rule out anginal equivalent.  If PET stress test is normal, she can follow-up in 1 year.   ROS:   Patient complains of dyspnea exertion but no chest pain or lower extremity edema.  She has no dizziness.  Studies Reviewed: .        Cardiac Studies & Procedures       ECHOCARDIOGRAM  ECHOCARDIOGRAM COMPLETE  03/02/2022  Narrative ECHOCARDIOGRAM REPORT    Patient Name:   Jordan Contreras Date of Exam: 03/02/2022 Medical Rec #:  [id number]      Height:       63.0 in Accession #:    [id number]     Weight:       149.2 lb Date of Birth:  May 14, 1956      BSA:          1.707 m Patient Age:    66 years       BP:           111/72 mmHg Patient Gender: F              HR:           51 bpm. Exam Location:  [address]  Procedure: 2D Echo, Cardiac Doppler and Color Doppler  Indications:    I25.10 CAD  History:        Patient has prior history of Echocardiogram examinations, most recent 10/29/2010. CAD and Previous Myocardial Infarction; Risk Factors:Dyslipidemia and Former Smoker.  Sonographer:    Samule Ohm RDCS Referring Phys: [phone] THOMAS A KELLY  IMPRESSIONS   1. Left ventricular ejection fraction, by estimation, is 60 to 65%. The left ventricle has normal function. The left ventricle has no regional wall motion abnormalities. Left ventricular diastolic parameters are consistent with Grade I diastolic dysfunction (impaired relaxation). 2. Right ventricular systolic function is  normal. The right ventricular size is normal. Tricuspid regurgitation signal is inadequate for assessing PA pressure. 3. The mitral valve is normal in structure. No evidence of mitral valve regurgitation. No evidence of mitral stenosis. 4. The aortic valve is tricuspid. There is mild calcification of the aortic valve. Aortic valve regurgitation is not visualized. Aortic valve sclerosis/calcification is present, without any evidence of aortic stenosis. 5. The inferior vena cava is normal in size with greater than 50% respiratory variability, suggesting right atrial pressure of 3 mmHg.  FINDINGS Left Ventricle: Left ventricular ejection fraction, by estimation, is 60 to 65%. The left ventricle has normal function. The left ventricle has no regional wall motion abnormalities. The left ventricular internal cavity size was  normal in size. There is no left ventricular hypertrophy. Left ventricular diastolic parameters are consistent with Grade I diastolic dysfunction (impaired relaxation).  Right Ventricle: The right ventricular size is normal. No increase in right ventricular wall thickness. Right ventricular systolic function is normal. Tricuspid regurgitation signal is inadequate for assessing PA pressure.  Left Atrium: Left atrial size was normal in size.  Right Atrium: Right atrial size was normal in size.  Pericardium: Trivial pericardial effusion is present.  Mitral Valve: The mitral valve is normal in structure. No evidence of mitral valve regurgitation. No evidence of mitral valve stenosis.  Tricuspid Valve: The tricuspid valve is normal in structure. Tricuspid valve regurgitation is not demonstrated.  Aortic Valve: The aortic valve is tricuspid. There is mild calcification of the aortic valve. Aortic valve regurgitation is not visualized. Aortic valve sclerosis/calcification is present, without any evidence of aortic stenosis. Aortic valve mean gradient measures 8.3 mmHg. Aortic valve peak gradient measures 17.6 mmHg. Aortic valve area, by VTI measures 1.44 cm.  Pulmonic Valve: The pulmonic valve was normal in structure. Pulmonic valve regurgitation is not visualized.  Aorta: The aortic root is normal in size and structure.  Venous: The inferior vena cava is normal in size with greater than 50% respiratory variability, suggesting right atrial pressure of 3 mmHg.  IAS/Shunts: No atrial level shunt detected by color flow Doppler.   LEFT VENTRICLE PLAX 2D LVIDd:         3.80 cm   Diastology LVIDs:         2.40 cm   LV e' medial:    10.10 cm/s LV PW:         1.10 cm   LV E/e' medial:  8.0 LV IVS:        0.90 cm   LV e' lateral:   14.50 cm/s LVOT diam:     1.70 cm   LV E/e' lateral: 5.5 LV SV:         67 LV SV Index:   39 LVOT Area:     2.27 cm   RIGHT VENTRICLE             IVC RV S prime:      12.10 cm/s  IVC diam: 1.70 cm TAPSE (M-mode): 2.0 cm  LEFT ATRIUM             Index        RIGHT ATRIUM           Index LA diam:        3.70 cm 2.17 cm/m   RA Pressure: 3.00 mmHg LA Vol (A2C):   38.1 ml 22.32 ml/m  RA Area:     11.60 cm LA Vol (A4C):   34.0 ml 19.92 ml/m  RA Volume:  26.20 ml  15.35 ml/m LA Biplane Vol: 36.2 ml 21.20 ml/m AORTIC VALVE AV Area (Vmax):    1.50 cm AV Area (Vmean):   1.44 cm AV Area (VTI):     1.44 cm AV Vmax:           210.00 cm/s AV Vmean:          131.333 cm/s AV VTI:            0.467 m AV Peak Grad:      17.6 mmHg AV Mean Grad:      8.3 mmHg LVOT Vmax:         139.00 cm/s LVOT Vmean:        83.400 cm/s LVOT VTI:          0.296 m LVOT/AV VTI ratio: 0.63  AORTA Ao Root diam: 2.90 cm Ao Asc diam:  2.90 cm  MITRAL VALVE               TRICUSPID VALVE MV Area (PHT): 3.34 cm    Estimated RAP:  3.00 mmHg MV Decel Time: 227 msec MV E velocity: 80.40 cm/s  SHUNTS MV A velocity: 91.30 cm/s  Systemic VTI:  0.30 m MV E/A ratio:  0.88        Systemic Diam: 1.70 cm  Dalton McleanMD Electronically signed by Wilfred Lacy Signature Date/Time: 03/02/2022/7:02:56 PM    Final             Risk Assessment/Calculations:             Physical Exam:   VS:  BP 126/82 (BP Location: Right Arm, Patient Position: Sitting, Cuff Size: Normal)   Pulse (!) 59   Ht 5\' 3"  (1.6 m)   Wt 148 lb (67.1 kg)   BMI 26.22 kg/m    Wt Readings from Last 3 Encounters:  07/06/23 148 lb (67.1 kg)  06/28/22 144 lb (65.3 kg)  06/17/22 146 lb (66.2 kg)    GEN: Well nourished, well developed in no acute distress NECK: No JVD; No carotid bruits CARDIAC: RRR, no murmurs, rubs, gallops RESPIRATORY:  Clear to auscultation without rales, wheezing or rhonchi  ABDOMEN: Soft, non-tender, non-distended EXTREMITIES:  No edema; No deformity   ASSESSMENT AND PLAN: .    Premature Coronary Artery Disease (CAD) History of early myocardial infarction (MI) at age 9 with  subsequent stenting of the left anterior descending (LAD) artery in 1996, 1998, and 2008. No recurrent chest pain. Recent onset of exertional dyspnea over the past month to six weeks. -Order PET stress test to evaluate for possible cardiac etiology of dyspnea. -Continue current medications:Aspirin, Atorvastatin, Zetia, and Lisinopril. Refill prescriptions for 90 days with three refills each.  Dyspnea on exertion: Happening in the past month, concerning for anginal equivalent.  See above, we will proceed with PET stress test.  Hyperlipidemia Well controlled on Atorvastatin and Zetia. -Continue current medications. -Order fasting lipid panel to monitor control.  Hypothyroidism On treatment. -Continue current medication. -Order TSH to monitor control.     Informed Consent   Shared Decision Making/Informed Consent The risks [chest pain, shortness of breath, cardiac arrhythmias, dizziness, blood pressure fluctuations, myocardial infarction, stroke/transient ischemic attack, nausea, vomiting, allergic reaction, radiation exposure, metallic taste sensation and life-threatening complications (estimated to be 1 in 10,000)], benefits (risk stratification, diagnosing coronary artery disease, treatment guidance) and alternatives of a cardiac PET stress test were discussed in detail with Ms. Curl and she agrees to proceed.     Dispo: If the  stress test is normal, she can follow-up in 1 year, earlier if stress test came back abnormal.  Signed, Azalee Course, PA

## 2023-07-06 NOTE — Patient Instructions (Signed)
Medication Instructions:  NO CHANGES *If you need a refill on your cardiac medications before your next appointment, please call your pharmacy*   Lab Work: CBC, CMET, FASTING LIPIDS, TSH TODAY.  If you have labs (blood work) drawn today and your tests are completely normal, you will receive your results only by: MyChart Message (if you have MyChart) OR A paper copy in the mail If you have any lab test that is abnormal or we need to change your treatment, we will call you to review the results.   Testing/Procedures: How to Prepare for Your Cardiac PET/CT Stress Test:  1. You may take normal medications before your test:   2. Nothing to eat or drink, except water, 3 hours prior to arrival time.   NO caffeine/decaffeinated products, or chocolate 12 hours prior to arrival.  3. NO perfume, cologne or lotion on chest or abdomen area.          - FEMALES - Please avoid wearing dresses to this appointment.  4. Total time is 1 to 2 hours; you may want to bring reading material for the waiting time.  5. Please report to Radiology at the [facility] [address] 30 minutes early for your test.  [address] [place], [place] [address]  IF YOU THINK YOU MAY BE PREGNANT, OR ARE NURSING PLEASE INFORM THE TECHNOLOGIST.  In preparation for your appointment, medication and supplies will be purchased.  Appointment availability is limited, so if you need to cancel or reschedule, please call the Radiology Department at [phone] [facility]) 24 hours in advance to avoid a cancellation fee of $100.00  What to Expect After you Arrive:  Once you arrive and check in for your appointment, you will be taken to a preparation room within the Radiology Department.  A technologist or Nurse will obtain your medical history, verify that you are correctly prepped for the exam, and explain the procedure.  Afterwards,  an IV will be started in your arm and electrodes will be placed on your skin  for EKG monitoring during the stress portion of the exam. Then you will be escorted to the PET/CT scanner.  There, staff will get you positioned on the scanner and obtain a blood pressure and EKG.  During the exam, you will continue to be connected to the EKG and blood pressure machines.  A small, safe amount of a radioactive tracer will be injected in your IV to obtain a series of pictures of your heart along with an injection of a stress agent.    After your Exam:  It is recommended that you eat a meal and drink a caffeinated beverage to counter act any effects of the stress agent.  Drink plenty of fluids for the remainder of the day and urinate frequently for the first couple of hours after the exam.  Your doctor will inform you of your test results within 7-10 business days.  For more information and frequently asked questions, please visit our website : [email or web address]  For questions about your test or how to prepare for your test, please call: Cardiac Imaging Nurse Navigators Office: [phone]    Follow-Up: At [facility], you and your health needs are our priority.  As part of our continuing mission to provide you with exceptional heart care, we have created designated Provider Care Teams.  These Care Teams include your primary Cardiologist (physician) and Advanced Practice Providers (APPs -  Physician Assistants and Nurse Practitioners) who all work together  to provide you with the care you need, when you need it.   Your next appointment:   1 year(s)  Provider:   Nicki Guadalajara, MD

## 2023-07-07 LAB — COMPREHENSIVE METABOLIC PANEL
ALT: 24 [IU]/L (ref 0–32)
AST: 24 [IU]/L (ref 0–40)
Albumin: 4.5 g/dL (ref 3.9–4.9)
Alkaline Phosphatase: 65 [IU]/L (ref 44–121)
BUN/Creatinine Ratio: 9 — ABNORMAL LOW (ref 12–28)
BUN: 9 mg/dL (ref 8–27)
Bilirubin Total: 0.9 mg/dL (ref 0.0–1.2)
CO2: 27 mmol/L (ref 20–29)
Calcium: 10 mg/dL (ref 8.7–10.3)
Chloride: 103 mmol/L (ref 96–106)
Creatinine, Ser: 0.98 mg/dL (ref 0.57–1.00)
Globulin, Total: 2.5 g/dL (ref 1.5–4.5)
Glucose: 95 mg/dL (ref 70–99)
Potassium: 4.4 mmol/L (ref 3.5–5.2)
Sodium: 142 mmol/L (ref 134–144)
Total Protein: 7 g/dL (ref 6.0–8.5)
eGFR: 62 mL/min/{1.73_m2} (ref >59)

## 2023-07-07 LAB — TSH: TSH: 2.19 u[IU]/mL (ref 0.450–4.500)

## 2023-07-07 LAB — CBC
Hematocrit: 46 % (ref 34.0–46.6)
Hemoglobin: 15 g/dL (ref 11.1–15.9)
MCH: 30.2 pg (ref 26.6–33.0)
MCHC: 32.6 g/dL (ref 31.5–35.7)
MCV: 93 fL (ref 79–97)
Platelets: 224 10*3/uL (ref 150–450)
RBC: 4.96 x10E6/uL (ref 3.77–5.28)
RDW: 13 % (ref 11.7–15.4)
WBC: 5.3 10*3/uL (ref 3.4–10.8)

## 2023-07-07 LAB — LIPID PANEL
Chol/HDL Ratio: 2.5 ratio (ref 0.0–4.4)
Cholesterol, Total: 139 mg/dL (ref 100–199)
HDL: 55 mg/dL (ref >39)
LDL Chol Calc (NIH): 63 mg/dL (ref 0–99)
Triglycerides: 121 mg/dL (ref 0–149)
VLDL Cholesterol Cal: 21 mg/dL (ref 5–40)

## 2023-08-04 ENCOUNTER — Encounter (HOSPITAL_COMMUNITY): Payer: Self-pay

## 2023-08-09 ENCOUNTER — Encounter (HOSPITAL_COMMUNITY): Payer: Medicare Other

## 2023-08-21 ENCOUNTER — Ambulatory Visit
Admission: RE | Admit: 2023-08-21 | Discharge: 2023-08-21 | Disposition: A | Discharge status: Home / Self Care | Payer: Medicare Other | Source: Ambulatory Visit | Attending: Internal Medicine | Admitting: Internal Medicine

## 2023-08-21 DIAGNOSIS — Z1231 Encounter for screening mammogram for malignant neoplasm of breast: Secondary | ICD-10-CM

## 2023-09-01 ENCOUNTER — Encounter (HOSPITAL_COMMUNITY): Payer: Self-pay

## 2023-09-05 ENCOUNTER — Encounter (HOSPITAL_COMMUNITY)
Admission: RE | Admit: 2023-09-05 | Discharge: 2023-09-05 | Disposition: A | Discharge status: Home / Self Care | Payer: Medicare Other | Source: Ambulatory Visit | Attending: Physician Assistant | Admitting: Physician Assistant

## 2023-09-05 DIAGNOSIS — R0609 Other forms of dyspnea: Secondary | ICD-10-CM | POA: Insufficient documentation

## 2023-09-05 LAB — NM PET CT CARDIAC PERFUSION MULTI W/ABSOLUTE BLOODFLOW
LV dias vol: 47 mL (ref 46–106)
MBFR: 3.7
Nuc Rest EF: 74 %
Nuc Stress EF: 76 %
Peak HR: 96 {beats}/min
Rest HR: 55 {beats}/min
Rest MBF: 0.7 ml/g/min
Rest Nuclear Isotope Dose: 17.4 mCi
Rest perfusion cavity size (mL): 47 mL
ST Depression (mm): 0 mm
Stress MBF: 2.59 ml/g/min
Stress Nuclear Isotope Dose: 17.7 mCi
Stress perfusion cavity size (mL): 50 mL
TID: 0.96

## 2023-09-05 MED ORDER — RUBIDIUM RB82 GENERATOR (RUBYFILL)
17.7000 | PACK | Freq: Once | INTRAVENOUS | Status: AC
  Administered 2023-09-05: 17.7 via INTRAVENOUS

## 2023-09-05 MED ORDER — RUBIDIUM RB82 GENERATOR (RUBYFILL)
17.4000 | PACK | Freq: Once | INTRAVENOUS | Status: AC
  Administered 2023-09-05: 17.4 via INTRAVENOUS

## 2023-09-05 MED ORDER — REGADENOSON 0.4 MG/5ML IV SOLN
0.4000 mg | Freq: Once | INTRAVENOUS | Status: AC
  Administered 2023-09-05: 0.4 mg via INTRAVENOUS

## 2023-09-05 MED ORDER — REGADENOSON 0.4 MG/5ML IV SOLN
INTRAVENOUS | Status: AC
  Filled 2023-09-05: qty 5

## 2023-09-06 ENCOUNTER — Other Ambulatory Visit (HOSPITAL_COMMUNITY): Payer: Medicare Other

## 2023-09-06 ENCOUNTER — Other Ambulatory Visit: Payer: Self-pay | Admitting: Physician Assistant

## 2023-10-18 ENCOUNTER — Telehealth: Payer: Self-pay | Admitting: Cardiovascular Disease

## 2023-10-18 MED ORDER — ATORVASTATIN CALCIUM 20 MG PO TABS: ORAL_TABLET | ORAL | 3 refills | Status: AC

## 2023-10-18 NOTE — Telephone Encounter (Signed)
 Pt's medication was sent to pt's pharmacy as requested. Confirmation received.

## 2023-10-18 NOTE — Telephone Encounter (Signed)
*  STAT* If patient is at the pharmacy, call can be transferred to refill team.   1. Which medications need to be refilled? (please list name of each medication and dose if known)   atorvastatin (LIPITOR) 20 MG tablet     2. Would you like to learn more about the convenience, safety, & potential cost savings by using the [facility]? No   3. Are you open to using the [facility] (Type [facility]. )No   4. Which pharmacy/location (including street and city if local pharmacy) is medication to be sent to?[id number] - [place], [place] - [address]    5. Do they need a 30 day or 90 day supply? 90 day  Pt not due for f/u until 11/25

## 2023-11-10 ENCOUNTER — Other Ambulatory Visit: Payer: Self-pay | Admitting: Internal Medicine

## 2023-11-10 DIAGNOSIS — Z0000 Encounter for general adult medical examination without abnormal findings: Secondary | ICD-10-CM

## 2023-11-13 ENCOUNTER — Other Ambulatory Visit: Payer: Self-pay | Admitting: Internal Medicine

## 2023-11-13 DIAGNOSIS — Z87891 Personal history of nicotine dependence: Secondary | ICD-10-CM

## 2023-11-27 ENCOUNTER — Ambulatory Visit
Admission: RE | Admit: 2023-11-27 | Discharge: 2023-11-27 | Disposition: A | Discharge status: Home / Self Care | Source: Ambulatory Visit | Attending: Internal Medicine

## 2023-11-27 DIAGNOSIS — Z87891 Personal history of nicotine dependence: Secondary | ICD-10-CM

## 2023-11-30 ENCOUNTER — Encounter: Payer: Self-pay | Admitting: Cardiovascular Disease

## 2023-12-05 ENCOUNTER — Telehealth: Payer: Self-pay | Admitting: Cardiovascular Disease

## 2023-12-05 NOTE — Telephone Encounter (Signed)
 Patient identification verified by 2 forms.  Called and spoke to patient  Patient states:  -Sob with excretion (rushing, carrying something up stairs) -I've lost some weight over the last few weeks.  - 6 different times: waking up, felt like the flu, sick as a dog - was told it was GERD from PCP.  -Daughter passed in 2022, "I've been under a lot of stress."   Patient denies:  -Changes in medication, routine.   - Swelling of any kind.             Interventions/Plan: -Appt made with APP tomorrow  Reviewed ED warning signs/precautions  Patient agrees with plan, no questions at this time

## 2023-12-05 NOTE — Telephone Encounter (Signed)
 Pt c/o Shortness Of Breath: STAT if SOB developed within the last 24 hours or pt is noticeably SOB on the phone  1. Are you currently SOB (can you hear that pt is SOB on the phone)? no  2. How long have you been experiencing SOB? Gotten worse in last two weels  3. Are you SOB when sitting or when up moving around? Moving around  4. Are you currently experiencing any other symptoms? Dizziness

## 2023-12-06 ENCOUNTER — Ambulatory Visit: Attending: Nurse Practitioner | Admitting: Nurse Practitioner

## 2023-12-06 ENCOUNTER — Encounter: Payer: Self-pay | Admitting: Nurse Practitioner

## 2023-12-06 VITALS — BP 104/78 | HR 57 | Ht 63.0 in | Wt 149.0 lb

## 2023-12-06 DIAGNOSIS — R42 Dizziness and giddiness: Secondary | ICD-10-CM | POA: Diagnosis present

## 2023-12-06 DIAGNOSIS — E039 Hypothyroidism, unspecified: Secondary | ICD-10-CM | POA: Diagnosis present

## 2023-12-06 DIAGNOSIS — R0602 Shortness of breath: Secondary | ICD-10-CM | POA: Insufficient documentation

## 2023-12-06 DIAGNOSIS — R0609 Other forms of dyspnea: Secondary | ICD-10-CM | POA: Diagnosis not present

## 2023-12-06 DIAGNOSIS — I2510 Atherosclerotic heart disease of native coronary artery without angina pectoris: Secondary | ICD-10-CM | POA: Diagnosis present

## 2023-12-06 DIAGNOSIS — E785 Hyperlipidemia, unspecified: Secondary | ICD-10-CM | POA: Diagnosis present

## 2023-12-06 NOTE — Progress Notes (Deleted)
 Cardiology Office Note:  .   Date:  12/06/2023  ID:  Jordan Contreras, DOB February 10, 1956, MRN [id number] PCP: Charlane Ferretti, DO  [facility] Providers Cardiologist:  Nicki Guadalajara, MD {   History of Present Illness: .   Jordan Contreras is a 68 y.o. female with history of premature CAD s/p stenting-LAD in 1996, stenting-LAD in 1998 (ISR), PTCA-LAD in 2008 (ISR), hyperlipidemia, and hypothyroidism.  She has a history of MI at the age of 60, s/p stenting-LAD in September 1996.  She underwent repeat stenting-LAD in 1998 secondary to in-stent restenosis, and again in May 2008 s/p PTCA in the setting of in-stent restenosis.  Most recent echocardiogram in July 2023 showed EF 60 to 65%, no significant valvular abnormalities. She was last seen in the office on 07/06/2023 and noted increased dyspnea on exertion.  Cardiac PET stress test was negative for ischemia, EF 76%.  Had abnormal liver contours suggestive of cirrhosis, follow-up with PCP was recommended.  She contacted our office on 12/05/2023 with a 2-week history of shortness of breath with activity, dizziness.  Premature CAD with DES to LAD and ISRS  1996, 1998, and 2008.  January 2025 at normal cardiac PET stress, EF 73%  Hyperlipidemia    ROS: Denies: Chest pain, shortness of breath, orthopnea, peripheral edema, palpitations, decreased exercise intolerance, fatigue, lightheadedness.   Studies Reviewed: .   Cardiac Studies & Procedures   ______________________________________________________________________________________________   STRESS TESTS  NM PET CT CARDIAC PERFUSION MULTI W/ABSOLUTE BLOODFLOW 09/05/2023  Narrative   LV perfusion is normal. There is no evidence of ischemia. There is no evidence of infarction.   Rest left ventricular function is normal. Rest EF: 74%. Stress left ventricular function is normal. Stress EF: 76%. End diastolic cavity size is normal.   Myocardial blood flow was computed to be 0.50ml/g/min at rest and  2.56ml/g/min at stress. Global myocardial blood flow reserve was 3.70 and was normal.   Coronary calcium assessment not performed due to prior revascularization.   The study is normal. The study is low risk.  Electronically signed by Lennie Odor, MD ___________________________________________________________________________________________________________________  CLINICAL DATA:  This over-read does not include interpretation of cardiac or coronary anatomy or pathology. The Cardiac PET CT interpretation by the cardiologist is attached.  COMPARISON:  None Available.  FINDINGS: Cardiovascular: Aortic atherosclerosis. Normal heart size. Left and right coronary artery calcifications. No pericardial effusion.  Limited Mediastinum/Nodes: No enlarged mediastinal, hilar, or axillary lymph nodes. Trachea and esophagus demonstrate no significant findings.  Limited Lungs/Pleura: Lungs are clear. No pleural effusion or pneumothorax.  Upper Abdomen: No acute abnormality. Coarse contour of the liver. Gallstones.  Musculoskeletal: No chest wall abnormality. No acute osseous findings.  IMPRESSION: 1. No acute CT findings of the included chest. 2. Coronary artery disease. 3. Coarse contour of the liver, suggestive of cirrhosis. Correlate with biochemical findings. 4. Cholelithiasis.  Aortic Atherosclerosis (ICD10-I70.0).   Electronically Signed By: Jearld Lesch M.D. On: 09/05/2023 13:25   ECHOCARDIOGRAM  ECHOCARDIOGRAM COMPLETE 03/02/2022  Narrative ECHOCARDIOGRAM REPORT    Patient Name:   Jordan Contreras Date of Exam: 03/02/2022 Medical Rec #:  [id number]      Height:       63.0 in Accession #:    [id number]     Weight:       149.2 lb Date of Birth:  1955/11/11      BSA:          1.707 m Patient Age:    69 years  BP:           111/72 mmHg Patient Gender: F              HR:           51 bpm. Exam Location:  [address]  Procedure: 2D Echo, Cardiac Doppler and Color  Doppler  Indications:    I25.10 CAD  History:        Patient has prior history of Echocardiogram examinations, most recent 10/29/2010. CAD and Previous Myocardial Infarction; Risk Factors:Dyslipidemia and Former Smoker.  Sonographer:    Samule Ohm RDCS Referring Phys: [phone] THOMAS A KELLY  IMPRESSIONS   1. Left ventricular ejection fraction, by estimation, is 60 to 65%. The left ventricle has normal function. The left ventricle has no regional wall motion abnormalities. Left ventricular diastolic parameters are consistent with Grade I diastolic dysfunction (impaired relaxation). 2. Right ventricular systolic function is normal. The right ventricular size is normal. Tricuspid regurgitation signal is inadequate for assessing PA pressure. 3. The mitral valve is normal in structure. No evidence of mitral valve regurgitation. No evidence of mitral stenosis. 4. The aortic valve is tricuspid. There is mild calcification of the aortic valve. Aortic valve regurgitation is not visualized. Aortic valve sclerosis/calcification is present, without any evidence of aortic stenosis. 5. The inferior vena cava is normal in size with greater than 50% respiratory variability, suggesting right atrial pressure of 3 mmHg.  FINDINGS Left Ventricle: Left ventricular ejection fraction, by estimation, is 60 to 65%. The left ventricle has normal function. The left ventricle has no regional wall motion abnormalities. The left ventricular internal cavity size was normal in size. There is no left ventricular hypertrophy. Left ventricular diastolic parameters are consistent with Grade I diastolic dysfunction (impaired relaxation).  Right Ventricle: The right ventricular size is normal. No increase in right ventricular wall thickness. Right ventricular systolic function is normal. Tricuspid regurgitation signal is inadequate for assessing PA pressure.  Left Atrium: Left atrial size was normal in size.  Right Atrium:  Right atrial size was normal in size.  Pericardium: Trivial pericardial effusion is present.  Mitral Valve: The mitral valve is normal in structure. No evidence of mitral valve regurgitation. No evidence of mitral valve stenosis.  Tricuspid Valve: The tricuspid valve is normal in structure. Tricuspid valve regurgitation is not demonstrated.  Aortic Valve: The aortic valve is tricuspid. There is mild calcification of the aortic valve. Aortic valve regurgitation is not visualized. Aortic valve sclerosis/calcification is present, without any evidence of aortic stenosis. Aortic valve mean gradient measures 8.3 mmHg. Aortic valve peak gradient measures 17.6 mmHg. Aortic valve area, by VTI measures 1.44 cm.  Pulmonic Valve: The pulmonic valve was normal in structure. Pulmonic valve regurgitation is not visualized.  Aorta: The aortic root is normal in size and structure.  Venous: The inferior vena cava is normal in size with greater than 50% respiratory variability, suggesting right atrial pressure of 3 mmHg.  IAS/Shunts: No atrial level shunt detected by color flow Doppler.   LEFT VENTRICLE PLAX 2D LVIDd:         3.80 cm   Diastology LVIDs:         2.40 cm   LV e' medial:    10.10 cm/s LV PW:         1.10 cm   LV E/e' medial:  8.0 LV IVS:        0.90 cm   LV e' lateral:   14.50 cm/s LVOT diam:  1.70 cm   LV E/e' lateral: 5.5 LV SV:         67 LV SV Index:   39 LVOT Area:     2.27 cm   RIGHT VENTRICLE             IVC RV S prime:     12.10 cm/s  IVC diam: 1.70 cm TAPSE (M-mode): 2.0 cm  LEFT ATRIUM             Index        RIGHT ATRIUM           Index LA diam:        3.70 cm 2.17 cm/m   RA Pressure: 3.00 mmHg LA Vol (A2C):   38.1 ml 22.32 ml/m  RA Area:     11.60 cm LA Vol (A4C):   34.0 ml 19.92 ml/m  RA Volume:   26.20 ml  15.35 ml/m LA Biplane Vol: 36.2 ml 21.20 ml/m AORTIC VALVE AV Area (Vmax):    1.50 cm AV Area (Vmean):   1.44 cm AV Area (VTI):     1.44 cm AV  Vmax:           210.00 cm/s AV Vmean:          131.333 cm/s AV VTI:            0.467 m AV Peak Grad:      17.6 mmHg AV Mean Grad:      8.3 mmHg LVOT Vmax:         139.00 cm/s LVOT Vmean:        83.400 cm/s LVOT VTI:          0.296 m LVOT/AV VTI ratio: 0.63  AORTA Ao Root diam: 2.90 cm Ao Asc diam:  2.90 cm  MITRAL VALVE               TRICUSPID VALVE MV Area (PHT): 3.34 cm    Estimated RAP:  3.00 mmHg MV Decel Time: 227 msec MV E velocity: 80.40 cm/s  SHUNTS MV A velocity: 91.30 cm/s  Systemic VTI:  0.30 m MV E/A ratio:  0.88        Systemic Diam: 1.70 cm  Dalton McleanMD Electronically signed by Wilfred Lacy Signature Date/Time: 03/02/2022/7:02:56 PM    Final          ______________________________________________________________________________________________       Risk Assessment/Calculations:   {Does this patient have ATRIAL FIBRILLATION?:[id number]} No BP recorded.  {Refresh Note OR Click here to enter BP  :1}***       Physical Exam:   VS:  There were no vitals taken for this visit.   Wt Readings from Last 3 Encounters:  07/06/23 148 lb (67.1 kg)  06/28/22 144 lb (65.3 kg)  06/17/22 146 lb (66.2 kg)    GEN: Well nourished, well developed in no acute distress NECK: No JVD; No carotid bruits CARDIAC: ***RRR, no murmurs, rubs, gallops RESPIRATORY:  Clear to auscultation without rales, wheezing or rhonchi  ABDOMEN: Soft, non-tender, non-distended EXTREMITIES:  No edema; No deformity   ASSESSMENT AND PLAN: .   ***    {Are you ordering a CV Procedure (e.g. stress test, cath, DCCV, TEE, etc)?   Press F2        :[id number]}  Dispo: ***  Signed, Abagail Kitchens, PA-C

## 2023-12-06 NOTE — Patient Instructions (Addendum)
 Medication Instructions:  Stop Lisinopril 2.5 mg as directed  *If you need a refill on your cardiac medications before your next appointment, please call your pharmacy*  Lab Work: NONE ordered at this time of appointment    Testing/Procedures: NONE ordered at this time of appointment    Follow-Up: At [facility], you and your health needs are our priority.  As part of our continuing mission to provide you with exceptional heart care, our providers are all part of one team.  This team includes your primary Cardiologist (physician) and Advanced Practice Providers or APPs (Physician Assistants and Nurse Practitioners) who all work together to provide you with the care you need, when you need it.  Your next appointment:   6-8 weeks   Provider:   Dr. Tresa Endo  We recommend signing up for the patient portal called "MyChart".  Sign up information is provided on this After Visit Summary.  MyChart is used to connect with patients for Virtual Visits (Telemedicine).  Patients are able to view lab/test results, encounter notes, upcoming appointments, etc.  Non-urgent messages can be sent to your provider as well.   To learn more about what you can do with MyChart, go to [email or web address].   Other Instructions       1st Floor: - Lobby - Registration  - Pharmacy  - Lab - Cafe  2nd Floor: - PV Lab - Diagnostic Testing (echo, CT, nuclear med)  3rd Floor: - Vacant  4th Floor: - TCTS (cardiothoracic surgery) - AFib Clinic - Structural Heart Clinic - Vascular Surgery  - Vascular Ultrasound  5th Floor: - [facility] Cardiology (general and EP) - Clinical Pharmacy for coumadin, hypertension, lipid, weight-loss medications, and med management appointments    Valet parking services will be available as well.

## 2023-12-06 NOTE — Progress Notes (Unsigned)
 Office Visit    Patient Name: Jordan Contreras Date of Encounter: 12/06/2023  Primary Care Provider:  Charlane Ferretti, DO Primary Cardiologist:  Nicki Guadalajara, MD  Chief Complaint    68 year old female with a history of premature CAD s/p stenting-LAD in 1996, stenting-LAD in 1998 (ISR), PTCA-LAD in 2008 (ISR), hyperlipidemia, and hypothyroidism who presents for follow-up related to CAD.  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    Allergies  Allergies  Allergen Reactions   Beta Adrenergic Blockers     Extreme fatigue    Chantix [Varenicline]    Codeine Other (See Comments)    Hallucinations    Morphine Other (See Comments)    hallucinations   Latex Rash    Skin blisters     Labs/Other Studies Reviewed    The following studies were reviewed today:  Cardiac Studies & Procedures   ______________________________________________________________________________________________   STRESS TESTS  NM PET CT CARDIAC PERFUSION MULTI W/ABSOLUTE BLOODFLOW 09/05/2023  Narrative   LV perfusion is normal. There is no evidence of ischemia. There is no evidence of infarction.   Rest left ventricular  function is normal. Rest EF: 74%. Stress left ventricular function is normal. Stress EF: 76%. End diastolic cavity size is normal.   Myocardial blood flow was computed to be 0.65ml/g/min at rest and 2.11ml/g/min at stress. Global myocardial blood flow reserve was 3.70 and was normal.   Coronary calcium assessment not performed due to prior revascularization.   The study is normal. The study is low risk.  Electronically signed by Lennie Odor, MD ___________________________________________________________________________________________________________________  CLINICAL DATA:  This over-read does not include interpretation of cardiac or coronary anatomy or pathology. The Cardiac PET CT interpretation by the cardiologist is attached.  COMPARISON:  None Available.  FINDINGS: Cardiovascular: Aortic atherosclerosis. Normal heart size. Left and right coronary artery calcifications. No pericardial effusion.  Limited Mediastinum/Nodes: No enlarged mediastinal, hilar, or axillary lymph nodes. Trachea and esophagus demonstrate no significant findings.  Limited Lungs/Pleura: Lungs are clear. No pleural effusion or pneumothorax.  Upper Abdomen: No acute abnormality. Coarse contour of the liver. Gallstones.  Musculoskeletal: No chest wall abnormality. No acute osseous findings.  IMPRESSION: 1. No acute CT findings of the included chest. 2. Coronary artery disease. 3. Coarse contour of the liver, suggestive of cirrhosis. Correlate with biochemical findings. 4. Cholelithiasis.  Aortic Atherosclerosis (ICD10-I70.0).   Electronically Signed By: Jearld Lesch M.D. On: 09/05/2023 13:25   ECHOCARDIOGRAM  ECHOCARDIOGRAM COMPLETE 03/02/2022  Narrative ECHOCARDIOGRAM REPORT    Patient Name:  Jordan Contreras Date of Exam: 03/02/2022 Medical Rec #:  [id number]      Height:       63.0 in Accession #:    [id number]     Weight:       149.2 lb Date of Birth:  September 24, 1955      BSA:           1.707 m Patient Age:    66 years       BP:           111/72 mmHg Patient Gender: F              HR:           51 bpm. Exam Location:  [address]  Procedure: 2D Echo, Cardiac Doppler and Color Doppler  Indications:    I25.10 CAD  History:        Patient has prior history of Echocardiogram examinations, most recent 10/29/2010. CAD and Previous Myocardial Infarction; Risk Factors:Dyslipidemia and Former Smoker.  Sonographer:    Samule Ohm RDCS Referring Phys: [phone] THOMAS A KELLY  IMPRESSIONS   1. Left ventricular ejection fraction, by estimation, is 60 to 65%. The left ventricle has normal function. The left ventricle has no regional wall motion abnormalities. Left ventricular diastolic parameters are consistent with Grade I diastolic dysfunction (impaired relaxation). 2. Right ventricular systolic function is normal. The right ventricular size is normal. Tricuspid regurgitation signal is inadequate for assessing PA pressure. 3. The mitral valve is normal in structure. No evidence of mitral valve regurgitation. No evidence of mitral stenosis. 4. The aortic valve is tricuspid. There is mild calcification of the aortic valve. Aortic valve regurgitation is not visualized. Aortic valve sclerosis/calcification is present, without any evidence of aortic stenosis. 5. The inferior vena cava is normal in size with greater than 50% respiratory variability, suggesting right atrial pressure of 3 mmHg.  FINDINGS Left Ventricle: Left ventricular ejection fraction, by estimation, is 60 to 65%. The left ventricle has normal function. The left ventricle has no regional wall motion abnormalities. The left ventricular internal cavity size was normal in size. There is no left ventricular hypertrophy. Left ventricular diastolic parameters are consistent with Grade I diastolic dysfunction (impaired relaxation).  Right Ventricle: The right ventricular size is normal. No increase in right ventricular wall  thickness. Right ventricular systolic function is normal. Tricuspid regurgitation signal is inadequate for assessing PA pressure.  Left Atrium: Left atrial size was normal in size.  Right Atrium: Right atrial size was normal in size.  Pericardium: Trivial pericardial effusion is present.  Mitral Valve: The mitral valve is normal in structure. No evidence of mitral valve regurgitation. No evidence of mitral valve stenosis.  Tricuspid Valve: The tricuspid valve is normal in structure. Tricuspid valve regurgitation is not demonstrated.  Aortic Valve: The aortic valve is tricuspid. There is mild calcification of the aortic valve. Aortic valve regurgitation is not visualized. Aortic valve sclerosis/calcification is present, without any evidence of aortic stenosis. Aortic valve mean gradient measures 8.3 mmHg. Aortic valve peak gradient measures 17.6 mmHg. Aortic valve area, by VTI measures 1.44 cm.  Pulmonic Valve: The pulmonic valve was normal in structure. Pulmonic valve regurgitation is not visualized.  Aorta: The aortic root is normal in size and structure.  Venous: The inferior vena cava is normal in size with greater than 50% respiratory variability, suggesting right atrial pressure of 3 mmHg.  IAS/Shunts: No atrial level shunt detected by color flow Doppler.   LEFT VENTRICLE PLAX  2D LVIDd:         3.80 cm   Diastology LVIDs:         2.40 cm   LV e' medial:    10.10 cm/s LV PW:         1.10 cm   LV E/e' medial:  8.0 LV IVS:        0.90 cm   LV e' lateral:   14.50 cm/s LVOT diam:     1.70 cm   LV E/e' lateral: 5.5 LV SV:         67 LV SV Index:   39 LVOT Area:     2.27 cm   RIGHT VENTRICLE             IVC RV S prime:     12.10 cm/s  IVC diam: 1.70 cm TAPSE (M-mode): 2.0 cm  LEFT ATRIUM             Index        RIGHT ATRIUM           Index LA diam:        3.70 cm 2.17 cm/m   RA Pressure: 3.00 mmHg LA Vol (A2C):   38.1 ml 22.32 ml/m  RA Area:     11.60 cm LA Vol (A4C):    34.0 ml 19.92 ml/m  RA Volume:   26.20 ml  15.35 ml/m LA Biplane Vol: 36.2 ml 21.20 ml/m AORTIC VALVE AV Area (Vmax):    1.50 cm AV Area (Vmean):   1.44 cm AV Area (VTI):     1.44 cm AV Vmax:           210.00 cm/s AV Vmean:          131.333 cm/s AV VTI:            0.467 m AV Peak Grad:      17.6 mmHg AV Mean Grad:      8.3 mmHg LVOT Vmax:         139.00 cm/s LVOT Vmean:        83.400 cm/s LVOT VTI:          0.296 m LVOT/AV VTI ratio: 0.63  AORTA Ao Root diam: 2.90 cm Ao Asc diam:  2.90 cm  MITRAL VALVE               TRICUSPID VALVE MV Area (PHT): 3.34 cm    Estimated RAP:  3.00 mmHg MV Decel Time: 227 msec MV E velocity: 80.40 cm/s  SHUNTS MV A velocity: 91.30 cm/s  Systemic VTI:  0.30 m MV E/A ratio:  0.88        Systemic Diam: 1.70 cm  Dalton McleanMD Electronically signed by Wilfred Lacy Signature Date/Time: 03/02/2022/7:02:56 PM    Final          ______________________________________________________________________________________________     Recent Labs: 07/06/2023: ALT 24; BUN 9; Creatinine, Ser 0.98; Hemoglobin 15.0; Platelets 224; Potassium 4.4; Sodium 142; TSH 2.190  Recent Lipid Panel    Component Value Date/Time   CHOL 139 07/06/2023 1018   TRIG 121 07/06/2023 1018   HDL 55 07/06/2023 1018   CHOLHDL 2.5 07/06/2023 1018   LDLCALC 63 07/06/2023 1018    History of Present Illness    68 year old female with the above past medical history including premature CAD s/p stenting-LAD in 1996, stenting-LAD in 1998 (ISR), PTCA-LAD in 2008 (ISR), hyperlipidemia, and hypothyroidism.  She has a history of MI at the age of 48, s/p  stenting-LAD in September 1996.  She underwent repeat stenting-LAD in 1988 secondary to in-stent restenosis, and again in May 2008 s/p PTCA in the setting of in-stent restenosis.  Most recent echocardiogram in July 2023 showed EF 60 to 65%, no significant valvular abnormalities. She was last seen in the office on 07/06/2023 and  noted increased dyspnea on exertion.  Cardiac PET stress test in 08/2023 was negative for ischemia, EF  76%. Noncardiac findings included coarse contour of the liver, suggestive cirrhosis, follow-up with PCP was recommended. She contacted our office on 12/05/2023 with a 2-week history of shortness of breath with activity, dizziness.   She presents today for follow-up.  Since her last visit She has been stable from a cardiac standpoint.  She continues to note dyspnea on exertion, associated dizziness.  Symptoms occur intermittently, often when climbing a flight of stairs.  She had a CT of her chest done per her PCP which showed some mild emphysema, coronary calcification.  She denies any chest pain.  Symptoms are not similar to prior anginal equivalent.  She denies any palpitations, presyncope, syncope, edema, PND, orthopnea, weight gain.  She does notice symptoms of shortness of breath in the setting of stress and anxiety.  BP is low in office today and she has not been checking her blood pressure at home.  We discussed possible repeat echocardiogram, possible ZIO monitor.  She declines any further workup at this time.  However, she is concerned that her symptoms could be coming from her heart.  Recent stress test overall reassuring.  Suspect there is some component of stress/anxiety.  Question whether or not low BP could be contributing to her symptoms.  She is on a's very small dose of lisinopril.  Will discontinue for now given low blood pressure in office today.  She will continue to monitor BP and report SBP consistently less than 110, SBP consistently greater than 140 mmHg.  Continue to monitor symptoms.  Reviewed ED precautions.  Follow-up in 2 months with Dr. Tresa Endo.  Symptoms persist, consider need for further ischemic evaluation.  Shortness of breath/dizziness: CAD: Hyperlipidemia: Hypothyroidism: Disposition:  Home Medications    Current Outpatient Medications  Medication Sig Dispense Refill    acyclovir (ZOVIRAX) 400 MG tablet Take 400 mg by mouth.     alendronate (FOSAMAX) 70 MG tablet Take 70 mg by mouth once a week.     Ascorbic Acid (VITAMIN C GUMMIE PO) Take by mouth.     aspirin 81 MG tablet Take 81 mg by mouth daily.     atorvastatin (LIPITOR) 20 MG tablet TAKE 1 TABLET BY MOUTH ONCE DAILY AT 6PM. 90 tablet 3   Biotin 5000 MCG CAPS Take 1 capsule by mouth daily.     Calcium Carbonate-Vitamin D (CALCIUM 600+D PO) Take 1 capsule by mouth daily.     Coenzyme Q10 (CO Q-10) 200 MG CAPS Take 1 tablet by mouth 2 (two) times daily.     ezetimibe (ZETIA) 10 MG tablet Take 1 tablet (10 mg total) by mouth daily. 90 tablet 3   levothyroxine (SYNTHROID, LEVOTHROID) 50 MCG tablet Take 50 mcg by mouth daily.     lisinopril (ZESTRIL) 2.5 MG tablet Take 1 tablet (2.5 mg total) by mouth every morning. 90 tablet 3   Multiple Vitamin (MULTIVITAMIN) tablet Take 1 tablet by mouth daily.     OMEGA-3 FATTY ACIDS PO Take 1,400 mg by mouth daily.     omeprazole (PRILOSEC) 20 MG capsule Take 20 mg by mouth daily.  No current facility-administered medications for this visit.     Review of Systems    ***.  All other systems reviewed and are otherwise negative except as noted above.    Physical Exam    VS:  BP 104/78 (BP Location: Left Arm, Patient Position: Sitting, Cuff Size: Normal)   Pulse (!) 57   Ht 5\' 3"  (1.6 m)   Wt 149 lb (67.6 kg)   SpO2 97%   BMI 26.39 kg/m   GEN: Well nourished, well developed, in no acute distress. HEENT: normal. Neck: Supple, no JVD, carotid bruits, or masses. Cardiac: RRR, no murmurs, rubs, or gallops. No clubbing, cyanosis, edema.  Radials/DP/PT 2+ and equal bilaterally.  Respiratory:  Respirations regular and unlabored, clear to auscultation bilaterally. GI: Soft, nontender, nondistended, BS + x 4. MS: no deformity or atrophy. Skin: warm and dry, no rash. Neuro:  Strength and sensation are intact. Psych: Normal affect.  Accessory Clinical Findings     ECG personally reviewed by me today - EKG Interpretation Date/Time:  Wednesday December 06 2023 14:17:46 EDT Ventricular Rate:  55 PR Interval:  158 QRS Duration:  74 QT Interval:  426 QTC Calculation: 407 R Axis:   81  Text Interpretation: Sinus bradycardia Nonspecific ST and T wave abnormality When compared with ECG of 06-Jul-2023 08:49, No significant change was found Confirmed by Bernadene Person ([id number]) on 12/06/2023 2:18:24 PM  - no acute changes.   Lab Results  Component Value Date   WBC 5.3 07/06/2023   HGB 15.0 07/06/2023   HCT 46.0 07/06/2023   MCV 93 07/06/2023   PLT 224 07/06/2023   Lab Results  Component Value Date   CREATININE 0.98 07/06/2023   BUN 9 07/06/2023   NA 142 07/06/2023   K 4.4 07/06/2023   CL 103 07/06/2023   CO2 27 07/06/2023   Lab Results  Component Value Date   ALT 24 07/06/2023   AST 24 07/06/2023   ALKPHOS 65 07/06/2023   BILITOT 0.9 07/06/2023   Lab Results  Component Value Date   CHOL 139 07/06/2023   HDL 55 07/06/2023   LDLCALC 63 07/06/2023   TRIG 121 07/06/2023   CHOLHDL 2.5 07/06/2023    No results found for: "HGBA1C"  Assessment & Plan    1.  ***      Joylene Grapes, NP 12/06/2023, 2:26 PM

## 2023-12-07 ENCOUNTER — Encounter: Payer: Self-pay | Admitting: Nurse Practitioner

## 2023-12-07 NOTE — Telephone Encounter (Signed)
 Good morning mam,  Dr. Tresa Endo will be out of the office until 12/18/2023. If you are still experiencing these symptoms, we advise you to go to your nearest ER. We would be happy to schedule with one of our Physician Assistants or Nurse Practitioners to answer any concerns you have regarding the Chest CT.  Please call the office at [phone] for an appointment.   -Crista Elliot

## 2024-02-09 ENCOUNTER — Other Ambulatory Visit: Payer: Medicare Other

## 2024-02-13 ENCOUNTER — Ambulatory Visit: Attending: Cardiovascular Disease | Admitting: Cardiovascular Disease

## 2024-02-13 ENCOUNTER — Encounter: Payer: Self-pay | Admitting: Cardiovascular Disease

## 2024-02-13 VITALS — BP 116/74 | HR 54 | Ht 63.0 in | Wt 154.0 lb

## 2024-02-13 DIAGNOSIS — I5189 Other ill-defined heart diseases: Secondary | ICD-10-CM | POA: Insufficient documentation

## 2024-02-13 DIAGNOSIS — I2510 Atherosclerotic heart disease of native coronary artery without angina pectoris: Secondary | ICD-10-CM | POA: Insufficient documentation

## 2024-02-13 DIAGNOSIS — Z9861 Coronary angioplasty status: Secondary | ICD-10-CM

## 2024-02-13 DIAGNOSIS — E039 Hypothyroidism, unspecified: Secondary | ICD-10-CM

## 2024-02-13 DIAGNOSIS — R0609 Other forms of dyspnea: Secondary | ICD-10-CM | POA: Diagnosis present

## 2024-02-13 DIAGNOSIS — E785 Hyperlipidemia, unspecified: Secondary | ICD-10-CM

## 2024-02-13 DIAGNOSIS — I252 Old myocardial infarction: Secondary | ICD-10-CM | POA: Diagnosis present

## 2024-02-13 MED ORDER — LISINOPRIL 2.5 MG PO TABS: 2.5000 mg | ORAL_TABLET | Freq: Every day | ORAL | 3 refills | Status: AC

## 2024-02-13 NOTE — Patient Instructions (Addendum)
 Medication Instructions:  LISINOPRIL  2.5 MG 1 TABLET DAILY *If you need a refill on your cardiac medications before your next appointment, please call your pharmacy*   Follow-Up: At [facility], you and your health needs are our priority.  As part of our continuing mission to provide you with exceptional heart care, our providers are all part of one team.  This team includes your primary Cardiologist (physician) and Advanced Practice Providers or APPs (Physician Assistants and Nurse Practitioners) who all work together to provide you with the care you need, when you need it.  Your next appointment:   6 month(s)  Provider:   Marlana Silvan, NP      Then, Randene Bustard, MD will plan to see you again in 1 year(s) We recommend signing up for the patient portal called MyChart.  Sign up information is provided on this After Visit Summary.  MyChart is used to connect with patients for Virtual Visits (Telemedicine).  Patients are able to view lab/test results, encounter notes, upcoming appointments, etc.  Non-urgent messages can be sent to your provider as well.   To learn more about what you can do with MyChart, go to [email or web address].

## 2024-02-13 NOTE — Progress Notes (Signed)
 Cardiology Office Note    Date:  02/15/2024   ID:  Jordan, Contreras 1956/02/15, MRN [id number]  PCP:  Jordan Skates, DO  Cardiologist:  Jordan Sor, MD    20 month follow-up office visit  History of Present Illness:  Jordan Contreras is a 68 y.o. female who has a history of premature CAD. She suffered initial myocardial infarction at age 75 and underwent stenting of her LAD in 05/18/95.  In 1998 she underwent repeat intervention, secondary to restenosis and, again in May 2008 underwent cutting balloon atherotomy for in-stent restenosis.  She had a long-standing history of tobacco use, but fortunately quit smoking in May 2008.  Additional problems include hypothyroidism, hyperlipidemia, and GERD.   An echo Doppler study in 2012,  showed an ejection fraction greater than 55% with grade 1 diastolic dysfunction. A nuclear perfusion study  in March 2012 was normal.   I saw her on Jan 20, 2015 which time she remained stable and was without chest pain or shortness of breath.  He had been on Pravachol  in addition to Zetia  hyperlipidemia but since her insurance was no longer covering Zetia  due to expense he had been off that for a month.  History of hypothyroidism she was on Synthroid 50 mcg, and was on Protonix  40 mg for GERD.  She continued being on DAPT with aspirin/Plavix and was taking fish oil.  She moved to [facility] and as result was followed at the [facility] clinic by Dr. Daisey Contreras. Since I last saw her in 2016, she changed her name to Jordan Contreras from [facility] after she was remarried.  She last saw Dr. Reichert in 2021/05/17.  Her daughter had passed away in Jan 19, 2022in her 73s from breast cancer.  I saw her on February 16, 2022 after she she has moved back to [place] and reestablish care with me.  At the time she was on atorvastatin  20 mg and Zetia  10 mg for hyperlipidemia and lisinopril  5 mg for hypertension.  She was on pantoprazole  20 mg for GERD and levothyroxine  50 mcg for hypothyroidism.  She takes alendronate 70 mg weekly for osteo porosis.  She has a prescription for Effexor which she takes 1/2 tablet as needed since her oldest daughter died of metastatic breast CA at 44.  She denied any recent chest pain or shortness of breath.  She has not had recent laboratory.    I recommended that she undergo a follow-up echo Doppler study which was done on March 02, 2022.  This revealed normal LV function with EF 60 to 65% without wall motion abnormality.  There was grade 1 diastolic dysfunction and evidence for mild aortic sclerosis without stenosis.  Repeat laboratory on February 22, 2022 showed total cholesterol 136, triglycerides 93, LDL 60.  LP(a) was normal at 41.4.  Renal function was normal with creatinine 1.08.  I last saw her in June 17, 2022.  She continued to feel well and denied any recurrent chest pain or shortness of breath.  She remains active and walks on a daily basis.  She continues to be on atorvastatin  20 mg and omega-3 fatty acid 1400 mg daily in addition to Zetia  for hyperlipidemia.  She is on levothyroxine 50 mcg for hypothyroidism and continues to be on lisinopril  at low-dose 2.5 mg for hypertension.  She has GERD for which she takes pantoprazole .  She also is on Fosamax weekly injection for her bones.    Since  I saw her, she has been evaluated by Jordan Ford, PA in November 2024 at which time she denied chest pain but was noticing increasing dyspnea on exertion.  There was no edema.  She was ultimately referred for a nuclear medicine PET stress test to rule out anginal equivalent.  NM PET CT cardiac perfusion study showed normal perfusion without evidence for ischemia.  Rest EF was 74% with stress EF 76%.  She had normal myocardial blood flow which increased appropriately.  Global myocardial blood flow reserve was 3 0.70 and normal.  Noncardiac findings included core contour of the liver, suggestive of cirrhosis as well as cholelithiasis.  She was  recently by Jordan Braver, NP on December 06, 2023.  She was under significant stress still grieving the death of her daughter who died of breast cancer in Mar 20, 2021.  A chest CT of her chest done by her PCP showed mild emphysema and coronary calcification.  Presently, she feels well.  She is not having chest pain.  At times she notes rare dizziness if he turns abruptly.  She does admit to some mild shortness of breath particularly when climbing steps.  She denies any chest pressure.  She had undergone laboratory by Dr. Valentin at [facility] in March 2025 which showed total cholesterol 149, triglycerides 61, LDL 72 and HDL 65.  She is now back on lisinopril  at low-dose 2.5 mg.  She continues to be on atorvastatin  20 mg and Zetia  10 mg daily for lipid management in addition to omega-3 fatty acid.  She is on levothyroxine 50 mcg for hypothyroidism and takes omeprazole for GERD.  She presents for evaluation.  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    Current Medications: Outpatient Medications Prior to Visit  Medication Sig Dispense Refill   acyclovir (ZOVIRAX) 400 MG tablet Take 400 mg by mouth.     alendronate (FOSAMAX) 70 MG tablet Take 70  mg by mouth once a week.     Ascorbic Acid (VITAMIN C GUMMIE PO) Take by mouth.     aspirin 81 MG tablet Take 81 mg by mouth daily.     atorvastatin  (LIPITOR) 20 MG tablet TAKE 1 TABLET BY MOUTH ONCE DAILY AT 6PM. 90 tablet 3   Biotin 5000 MCG CAPS Take 1 capsule by mouth daily.     Calcium  Carbonate-Vitamin D (CALCIUM  600+D PO) Take 1 capsule by mouth daily.     Coenzyme Q10 (Contreras Q-10) 200 MG CAPS Take 1 tablet by mouth 2 (two) times daily.     ezetimibe  (ZETIA )  10 MG tablet Take 1 tablet (10 mg total) by mouth daily. 90 tablet 3   levothyroxine (SYNTHROID, LEVOTHROID) 50 MCG tablet Take 50 mcg by mouth daily.     Multiple Vitamin (MULTIVITAMIN) tablet Take 1 tablet by mouth daily.     OMEGA-3 FATTY ACIDS PO Take 1,400 mg by mouth daily.     omeprazole (PRILOSEC) 20 MG capsule Take 20 mg by mouth daily.     No facility-administered medications prior to visit.     Allergies:   Beta adrenergic blockers, Chantix [varenicline], Codeine, Morphine, and Latex   Social History   Socioeconomic History   Marital status: Married    Spouse name: Not on file   Number of children: 3   Years of education: Not on file   Highest education level: Not on file  Occupational History    Employer: [employer]  Tobacco Use   Smoking status: Former    Current packs/day: 0.00    Types: Cigarettes    Quit date: 12/28/2006    Years since quitting: 17.1   Smokeless tobacco: Never  Vaping Use   Vaping status: Never Used  Substance and Sexual Activity   Alcohol use: No    Alcohol/week: 0.0 standard drinks of alcohol   Drug use: No   Sexual activity: Not on file  Other Topics Concern   Not on file  Social History Narrative   Not on file   Social Drivers of Health   Financial Resource Strain: Not on file  Food Insecurity: Not on file  Transportation Needs: Not on file  Physical Activity: Not on file  Stress: Not on file  Social Connections: Not on file     Socially she is married for the  last 5 years.  She has 3 children and 3 grandchildren.  She currently is living with her husband and mother-in-law in [place].  Previously she was in the [employer].  There is remote tobacco history but she quit on Jan 25, 2007.  There is no alcohol use.  She walks occasionally.  Family History:  The patient's family history includes Breast cancer (age of onset: 85) in her daughter; Breast cancer (age of onset: 63) in her paternal aunt; Cancer in her maternal uncle; Diabetes in her sister; Heart disease in her father and mother; Lung cancer in her sister.  Parents are deceased.  Father had a brain aneurysm and died suddenly at age 64.  Mother died with emphysema at age 61.  She has 3 sisters 1 died at age 37 with lung cancer.  She has 3 children and her daughter at age 83 died with metastatic breast cancer.  Her living children are 58 and 61.  ROS General: Negative; No fevers, chills, or night sweats;  HEENT: Negative; No changes in vision or hearing, sinus congestion, difficulty swallowing Pulmonary: Negative; No cough, wheezing, shortness of breath, hemoptysis Cardiovascular: See HPI GI: Negative; No nausea, vomiting, diarrhea, or abdominal pain GU: Negative; No dysuria, hematuria, or difficulty voiding Musculoskeletal: Negative; no myalgias, joint pain, or weakness Hematologic/Oncology: Negative; no easy bruising, bleeding Endocrine: Negative; no heat/cold intolerance; no diabetes Neuro: Negative; no changes in balance, headaches Skin: Negative; No rashes or skin lesions Psychiatric: Significant grieving with the death of her daughter who died in 2021/02/28 Sleep: Negative; No snoring, daytime sleepiness, hypersomnolence, bruxism, restless legs, hypnogognic hallucinations, no cataplexy Other comprehensive 14 point system review is negative.   PHYSICAL EXAM:   VS:  BP 116/74   Pulse ROLLEN)  54   Ht 5' 3 (1.6 m)   Wt 154 lb (69.9 kg)   SpO2 92%   BMI 27.28 kg/m     Repeat blood  pressure by me was 122/74  Wt Readings from Last 3 Encounters:  02/13/24 154 lb (69.9 kg)  12/06/23 149 lb (67.6 kg)  07/06/23 148 lb (67.1 kg)    General: Alert, oriented, no distress.  Skin: normal turgor, no rashes, warm and dry HEENT: Normocephalic, atraumatic. Pupils equal round and reactive to light; sclera anicteric; extraocular muscles intact;  Nose without nasal septal hypertrophy Mouth/Parynx benign; Mallinpatti scale 3 Neck: No JVD, no carotid bruits; normal carotid upstroke Lungs: clear to ausculatation and percussion; no wheezing or rales Chest wall: without tenderness to palpitation Heart: PMI not displaced, RRR, s1 s2 normal, 1/6 systolic murmur, no diastolic murmur, no rubs, gallops, thrills, or heaves Abdomen: soft, nontender; no hepatosplenomehaly, BS+; abdominal aorta nontender and not dilated by palpation. Back: no CVA tenderness Pulses 2+ Musculoskeletal: full range of motion, normal strength, no joint deformities Extremities: no clubbing cyanosis or edema, Homan's sign negative  Neurologic: grossly nonfocal; Cranial nerves grossly wnl Psychologic: Normal mood and affect    Studies/Labs Reviewed:   EKG Interpretation Date/Time:  Tuesday February 13 2024 09:44:01 EDT Ventricular Rate:  54 PR Interval:  168 QRS Duration:  74 QT Interval:  410 QTC Calculation: 388 R Axis:   66  Text Interpretation: Sinus bradycardia When compared with ECG of 06-Dec-2023 14:17, No significant change was found Confirmed by Burnard Ned ([id number]) on 02/13/2024 9:59:14 AM    June 17, 2022 ECG (independently read by me): NSR at 61, nonspecific T wave abnormality  February 16, 2022 ECG (independently read by me): NSR at 61; nonspecific T wave abnorrmality  Recent Labs:    Latest Ref Rng & Units 07/06/2023   10:18 AM 02/22/2022   10:52 AM 03/29/2011    8:19 PM  BMP  Glucose 70 - 99 mg/dL 95  96  97   BUN 8 - 27 mg/dL 9  15  11    Creatinine 0.57 - 1.00 mg/dL 9.01  8.91  9.31    BUN/Creat Ratio 12 - 28 9  14     Sodium 134 - 144 mmol/L 142  141  140   Potassium 3.5 - 5.2 mmol/L 4.4  4.8  3.5   Chloride 96 - 106 mmol/L 103  103  103   CO2 20 - 29 mmol/L 27  26  26    Calcium  8.7 - 10.3 mg/dL 89.9  9.9  9.4         Latest Ref Rng & Units 07/06/2023   10:18 AM 02/22/2022   10:52 AM 03/29/2011   10:58 PM  Hepatic Function  Total Protein 6.0 - 8.5 g/dL 7.0  7.2  7.2   Albumin 3.9 - 4.9 g/dL 4.5  4.7  3.8   AST 0 - 40 IU/L 24  25  24    ALT 0 - 32 IU/L 24  27  44   Alk Phosphatase 44 - 121 IU/L 65  61  87   Total Bilirubin 0.0 - 1.2 mg/dL 0.9  0.9  0.6   Bilirubin, Direct 0.0 - 0.3 mg/dL   0.1        Latest Ref Rng & Units 07/06/2023   10:18 AM 02/22/2022   10:52 AM 03/29/2011    8:19 PM  CBC  WBC 3.4 - 10.8 x10E3/uL 5.3  5.5  11.2   Hemoglobin 11.1 -  15.9 g/dL 84.9  85.8  85.3   Hematocrit 34.0 - 46.6 % 46.0  43.0  41.9   Platelets 150 - 450 x10E3/uL 224  186  326    Lab Results  Component Value Date   MCV 93 07/06/2023   MCV 91 02/22/2022   MCV 86.6 03/29/2011   Lab Results  Component Value Date   TSH 2.190 07/06/2023   No results found for: HGBA1C   BNP No results found for: BNP  ProBNP No results found for: PROBNP   Lipid Panel     Component Value Date/Time   CHOL 139 07/06/2023 1018   TRIG 121 07/06/2023 1018   HDL 55 07/06/2023 1018   CHOLHDL 2.5 07/06/2023 1018   LDLCALC 63 07/06/2023 1018   LABVLDL 21 07/06/2023 1018     RADIOLOGY: No results found.   Additional studies/ records that were reviewed today include:   Reviewed prior records from 2016 and records of [facility] clinic were reviewed.   ASSESSMENT:    1. Coronary artery disease involving native coronary artery of native heart without angina pectoris   2. Old MI (myocardial infarction):PCI LAD 1996   3. History of percutaneous coronary intervention: 1996, 1998, 2008   4. Hyperlipidemia LDL goal <70   5. Hypothyroidism, unspecified type   6. DOE  (dyspnea on exertion)   7. Grade I diastolic dysfunction     PLAN:  Jordan Contreras is a very pleasant 68 year old female who has a history of CAD and had suffered an initial myocardial infarction at age 22 and underwent stenting of her LAD in September 1996.  She required repeat intervention in 1999 secondary to restenosis and again in May 2008 when she underwent underwent Cutting Balloon atherotomy for in-stent restenosis.  She had a prior longstanding tobacco history but fortunately quit in 2008.  After not having seen her since 2016, she reestablish care with me in May 2023.  A follow-up echo cardiographic evaluation on March 02, 2022  showed normal LV systolic function with EF 60 to 65%, normal wall motion and grade 1 diastolic dysfunction.  There was mild aortic sclerosis without stenosis.  She underwent subsequent laboratory on February 22, 2022.  Lipid studies were excellent with total cholesterol 136, triglycerides 93, HDL 59, LDL 60. I also checked LP(a) which was normal at 41.4.  Recently she had developed some shortness of breath.  A NM PET stress study showed normal perfusion without ischemia.  Her blood pressure today is stable and she is back on lisinopril  after being off this for some time.  She is on atorvastatin  20 mg and Zetia  10 mg for lipid management with most recent LDL cholesterol at 72 in March 2025 obtained at [facility].  She is on levothyroxine 50 mcg for hypothyroidism.  Clinically she is stable from a cardiovascular standpoint.  She is aware of my imminent retirement.  In 6 months I have recommended she follow-up with Jordan Braver, NP in 1 year I will transition her to the care of Dr. Anner for follow-up Cardiologic evaluation.   Medication Adjustments/Labs and Tests Ordered: Current medicines are reviewed at length with the patient today.  Concerns regarding medicines are outlined above.  Medication changes, Labs and Tests ordered today are listed in the Patient  Instructions below. Patient Instructions  Medication Instructions:  LISINOPRIL  2.5 MG 1 TABLET DAILY *If you need a refill on your cardiac medications before your next appointment, please call your pharmacy*   Follow-Up: At [facility], you and your health needs are our priority.  As part of our continuing mission to provide you with exceptional heart care, our providers are all part of one team.  This team includes your primary Cardiologist (physician) and Advanced Practice Providers or APPs (Physician Assistants and Nurse Practitioners) who all work together to provide you with the care you need, when you need it.  Your next appointment:   6 month(s)  Provider:   Damien Braver, NP      Then, Alm Clay, MD will plan to see you again in 1 year(s) We recommend signing up for the patient portal called MyChart.  Sign up information is provided on this After Visit Summary.  MyChart is used to connect with patients for Virtual Visits (Telemedicine).  Patients are able to view lab/test results, encounter notes, upcoming appointments, etc.  Non-urgent messages can be sent to your provider as well.   To learn more about what you can do with MyChart, go to [email or web address].     Signed, Jordan Sor, MD  02/15/2024 6:07 PM    [facility] [address], [place], [place]  [address] Phone: [phone]

## 2024-02-15 ENCOUNTER — Encounter: Payer: Self-pay | Admitting: Cardiovascular Disease

## 2024-07-08 ENCOUNTER — Other Ambulatory Visit: Payer: Self-pay | Admitting: Internal Medicine

## 2024-07-08 DIAGNOSIS — Z1231 Encounter for screening mammogram for malignant neoplasm of breast: Secondary | ICD-10-CM

## 2024-08-03 ENCOUNTER — Other Ambulatory Visit: Payer: Self-pay | Admitting: Physician Assistant

## 2024-08-27 ENCOUNTER — Ambulatory Visit

## 2024-08-27 ENCOUNTER — Ambulatory Visit
Admission: RE | Admit: 2024-08-27 | Discharge: 2024-08-27 | Disposition: A | Discharge status: Home / Self Care | Source: Ambulatory Visit | Attending: Internal Medicine | Admitting: Internal Medicine

## 2024-08-27 DIAGNOSIS — Z1231 Encounter for screening mammogram for malignant neoplasm of breast: Secondary | ICD-10-CM
